# Patient Record
Sex: Female | Born: 1990 | Race: Black or African American | Hispanic: No | Marital: Single | Smoking: Former smoker
Health system: Southern US, Community
[De-identification: ages and names within clinical notes are randomized; demographics above are authoritative.]

## PROBLEM LIST (undated history)

## (undated) ENCOUNTER — Inpatient Hospital Stay (HOSPITAL_COMMUNITY): Payer: Self-pay

## (undated) DIAGNOSIS — E559 Vitamin D deficiency, unspecified: Secondary | ICD-10-CM

## (undated) DIAGNOSIS — Z789 Other specified health status: Secondary | ICD-10-CM

## (undated) HISTORY — DX: Vitamin D deficiency, unspecified: E55.9

## (undated) HISTORY — PX: ELBOW SURGERY: SHX618

---
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
## 2009-09-01 ENCOUNTER — Encounter (HOSPITAL_COMMUNITY): Admission: RE | Admit: 2009-09-01 | Discharge: 2009-10-13 | Payer: Self-pay | Admitting: Internal Medicine

## 2010-03-22 IMAGING — CT CT HEAD W/O CM
1 series · 16 of 30 positions shown, 20 images · non-contrast
Comparison: None

CLINICAL DATA: FALL.

CT HEAD WITHOUT CONTRAST
TECHNIQUE: Contiguous axial images were obtained from the base of
the skull through the vertex without contrast

[Series 2: headseq 4.8 h45s · axial · 0.43mm/px · z∈[-194,-59]mm · 16 of 30 slices shown, 20 images]
[im 2/30  brain]
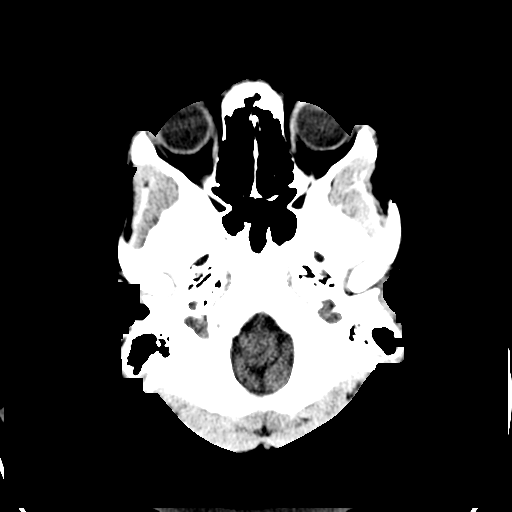
[im 2/30  bone]
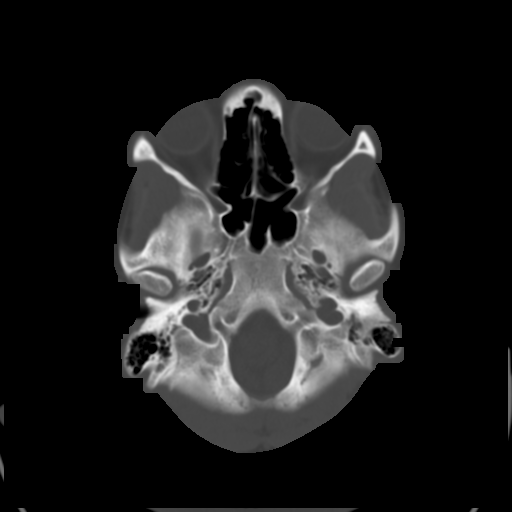
[im 4/30  brain]
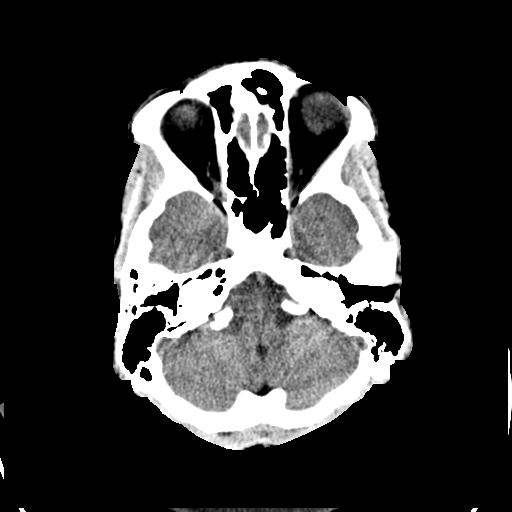
[im 6/30  brain]
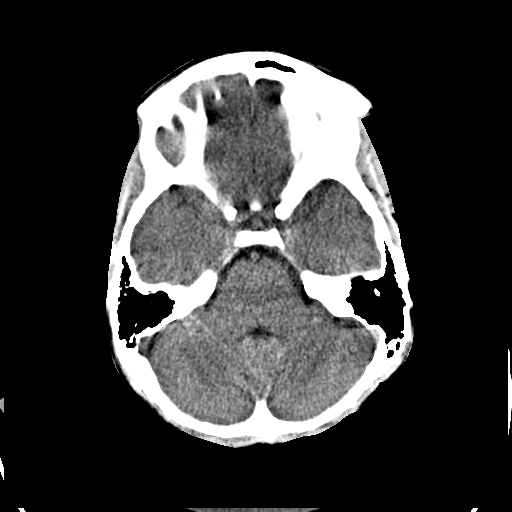
[im 8/30  brain]
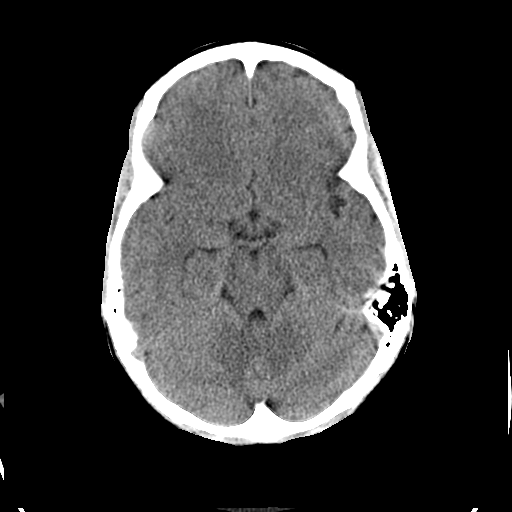
[im 9/30  brain]
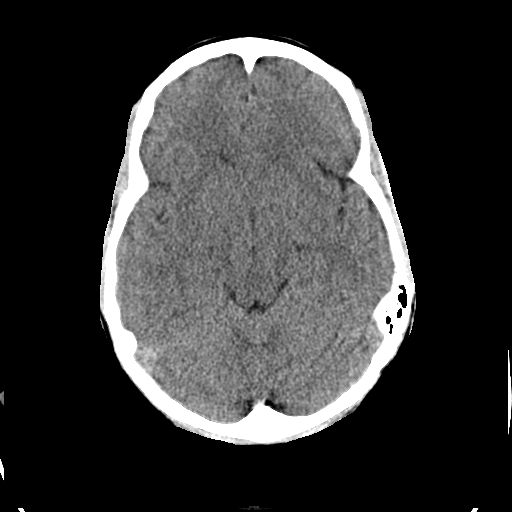
[im 9/30  bone]
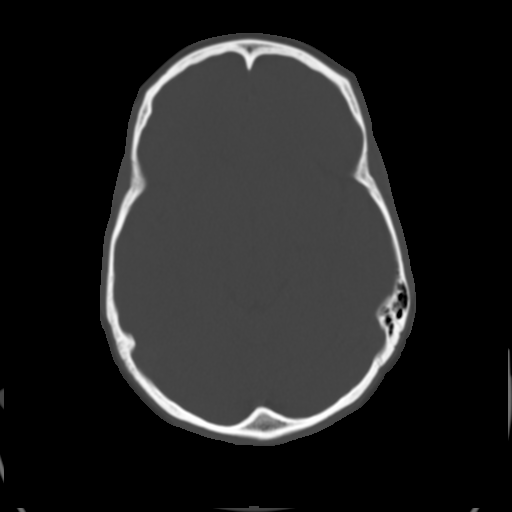
[im 11/30  brain]
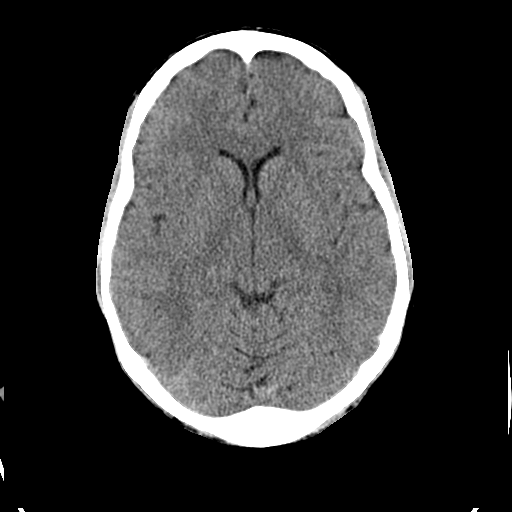
[im 13/30  brain]
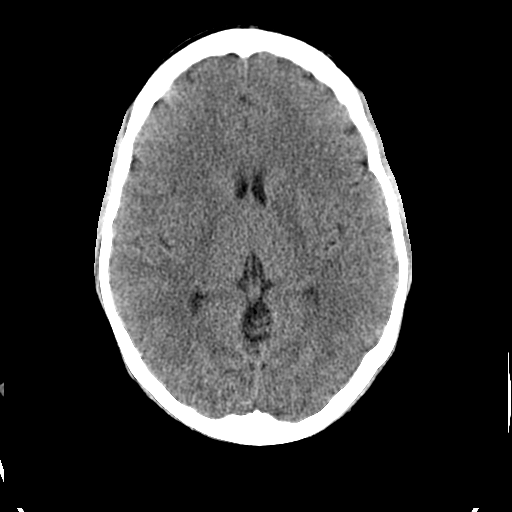
[im 15/30  brain]
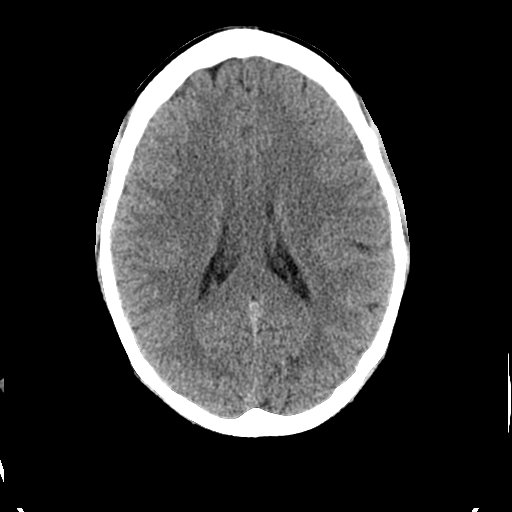
[im 16/30  brain]
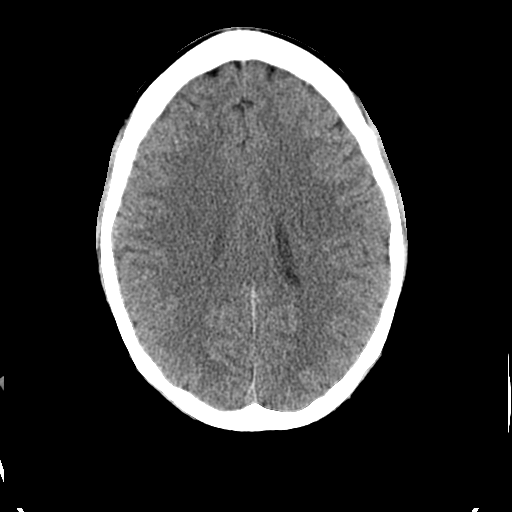
[im 16/30  bone]
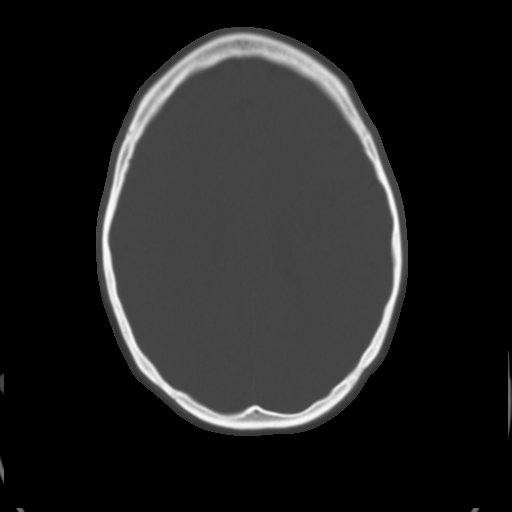
[im 18/30  brain]
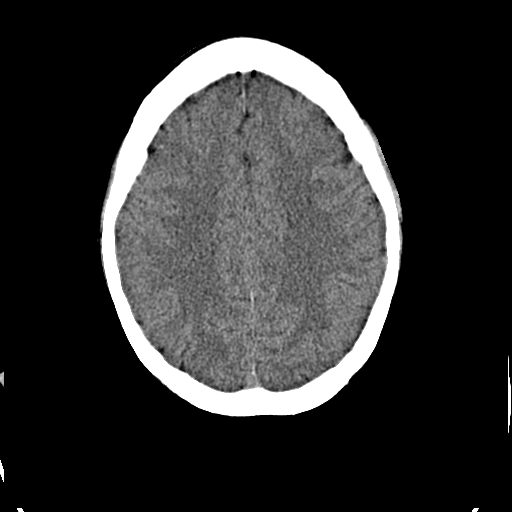
[im 20/30  brain]
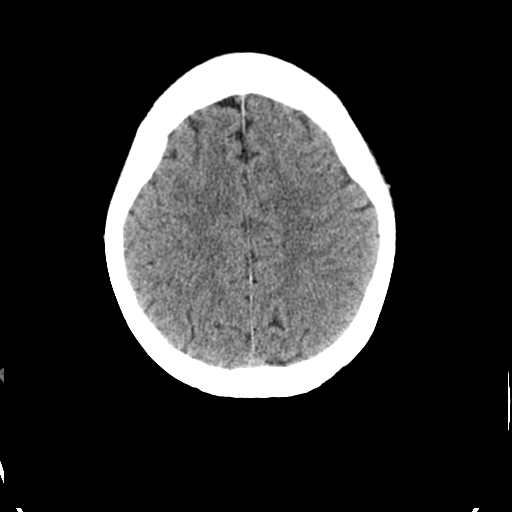
[im 22/30  brain]
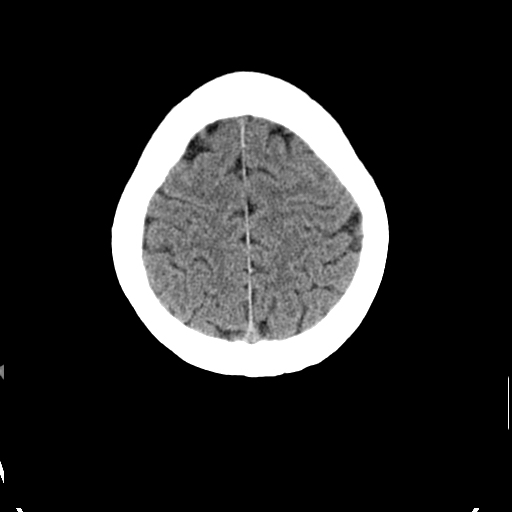
[im 23/30  brain]
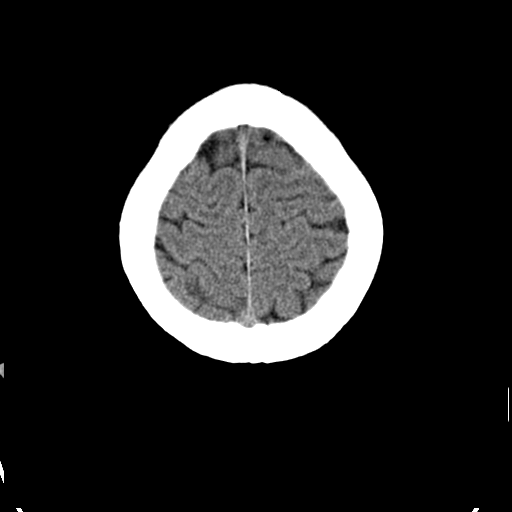
[im 23/30  bone]
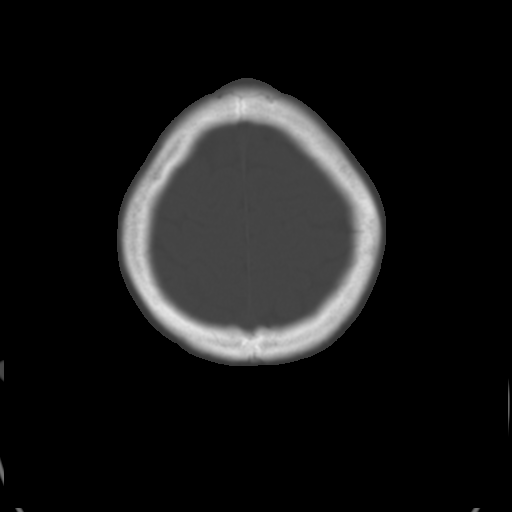
[im 25/30  brain]
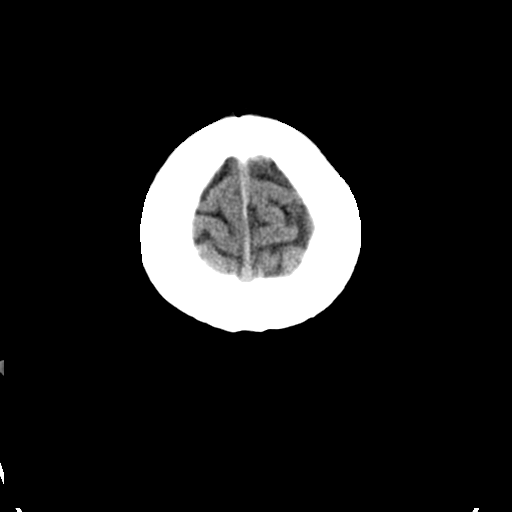
[im 27/30  brain]
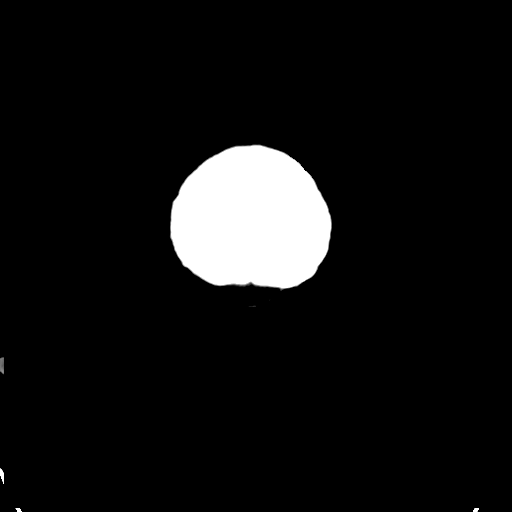
[im 29/30  brain]
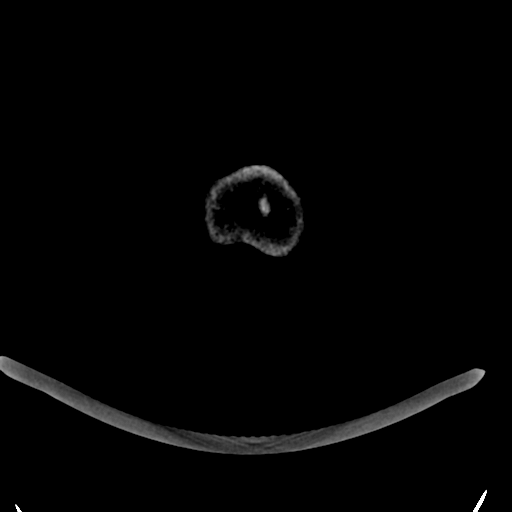

[16 of 30 positions shown; findings below may reference images not displayed]

FINDINGS: The brain has a normal appearance without evidence for
hemorrhage, acute infarction, hydrocephalus, or mass lesion.  There
is no extra axial fluid collection.  The skull and paranasal
sinuses are normal.
IMPRESSION: Normal CT of the head without contrast.

## 2011-02-27 NOTE — Op Note (Signed)
NAME:  Tonya Kane, Tonya Kane              ACCOUNT NO.:  [id number]   MEDICAL RECORD NO.:  i                 PATIENT TYPE:  AMB   LOCATION:  [facility]                         FACILITY:  [facility]   PHYSICIAN:  Deidre Ala, M.D.    DATE OF BIRTH:  01-Apr-1991   DATE OF PROCEDURE:  02/19/2008  DATE OF DISCHARGE:                               OPERATIVE REPORT   PREOPERATIVE DIAGNOSES:  1. 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  2. Lateral patellar tilt and track.   POSTOPERATIVE DIAGNOSES:  1. 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  2. Lateral patellar tilt and track.  3. Degenerative fraying, significant of inner rim lateral meniscus x2.   PROCEDURE:  1. Right and left knee arthroscopy with medial and lateral plica      excisions.  2. Partial lateral meniscectomy.  3. Lateral retinacular releases   SURGEON:  1. Charlesetta Shanks, M.D.   ASSISTANT:  Phineas Semen, P.A.   ANESTHESIA:  General with LMA.   CULTURES:  None.   DRAINS:  None.   ESTIMATED BLOOD LOSS:  Minimal.   TOURNIQUET TIME:  Right 24 minutes, left 24 minutes.   PATHOLOGIC FINDINGS AND HISTORY:  Tonya Kane was sent by Dr. Marisue Brooklyn  of [facility] for bilateral knee pain of several  months duration.  This was in October 2008, secondary to cheerleading.  She had x-rays showing slight patellar tilt and track with a somewhat  flattened lateral femoral condyle.  She did have no VMO atrophy,  somewhat mobile patella on extension, felt better when the patella was  held medially and with a large palpable plica and somewhat palpable  plica, especially laterally left.  We discussed the pathophysiology, put  her in braces, put her on anti-inflammatories.  She came back on January 17, 2008.  She was going to be cheerleading for either [employer] or [employer].  The knees were not getting any better.  She did not want  injections.  She decided to  proceed with surgical intervention.  At  surgery on both knees, we found significant thick fibrous plica bands  all the way over the medial femoral condyle.  There was rubbing of the  medial femoral condyle secondary to this large thick fibrous plica.  She  had pristine joint surfaces, except for the medial femoral condyle where  the patella was tracking.  There was a soft posteromedial patellar facet  with a tight lateral retinaculum.  There was lateral synovitic plica in  the gutter.  She had ACL intact.  Medial meniscus and medial compartment  was intact on both sides and the lateral meniscus inner rim was  significantly frayed.  This was equivalent on both sides.  She underwent  plica excision, lateral release and debridement of the inner rim lateral  meniscus and some light ablation chondroplasty on the medial femoral  condyle where the plica had rubbed on both sides.   DESCRIPTION OF PROCEDURE:  With adequate anesthesia obtained using LMA  technique and 1 gm Ancef given IV prophylaxis, the patient was placed in  the supine position.  Both lower extremities were prepped from the  malleoli to the leg holders in the standard fashion.  After standard  prepping and draping, attention was turned to the right where Esmarch  exsanguination was used.  The tourniquet was let up to 300 mmHg.  Superolateral inflow portal was made, the knee was insufflated with  normal saline with the arthroscopic pump.  Medial and lateral scope  portals were then made the joint was thoroughly inspected.  I then  shaved out the medial plica to the sidewall and lysed the medial bands  and cauterized bleeding points.  I then smoothed the medial femoral  condyle.  I then checked and probed the medial meniscus and ACL.  Portals were reversed and I used basket and shaver to smooth the inner  rim lateral meniscus and seal with the ablator on 1.  I then shaved out  the lateral plica and the gutter, and then did an  arthroscopic lateral  retinacular release from the vastus lateralis to the joint line.  Bleeding points were cauterized.  The knee was then irrigated through  the scope.  Marcaine 0.5% with morphine was injected in and about the  joint.  The portals were left open.  A bulky sterile compressive  dressing was applied with lateral foam pad for tamponade and EZ wrap  placed.  Attention was then turned to the left knee where the exact same  procedure was performed.  The patient then having tolerated the  procedure well was awakened and taken to recovery room in satisfactory  condition to be discharged per outpatient routine.  Given Percocet for  pain and told to call the office for an appointment for recheck  tomorrow.           ______________________________  V. Charlesetta Shanks, M.D.     VEP/MEDQ  D:  02/19/2008  T:  02/19/2008  Job:  [id number]   cc:   Lovenia Kim, D.O.  Fax: [phone]

## 2011-09-20 ENCOUNTER — Encounter (HOSPITAL_COMMUNITY): Payer: Self-pay | Admitting: *Deleted

## 2011-09-25 ENCOUNTER — Encounter (HOSPITAL_COMMUNITY): Payer: Self-pay

## 2011-10-03 ENCOUNTER — Other Ambulatory Visit: Payer: Self-pay | Admitting: Obstetrics and Gynecology

## 2011-10-11 ENCOUNTER — Ambulatory Visit (HOSPITAL_COMMUNITY): Payer: BC Managed Care – PPO | Admitting: Anesthesiology

## 2011-10-11 ENCOUNTER — Ambulatory Visit (HOSPITAL_COMMUNITY)
Admission: RE | Admit: 2011-10-11 | Discharge: 2011-10-11 | Disposition: A | Discharge status: Home / Self Care | Payer: BC Managed Care – PPO | Source: Ambulatory Visit | Attending: Obstetrics and Gynecology | Admitting: Obstetrics and Gynecology

## 2011-10-11 ENCOUNTER — Encounter (HOSPITAL_COMMUNITY): Payer: Self-pay | Admitting: *Deleted

## 2011-10-11 ENCOUNTER — Encounter (HOSPITAL_COMMUNITY)
Admission: RE | Disposition: A | Discharge status: Home / Self Care | Payer: Self-pay | Source: Ambulatory Visit | Attending: Obstetrics and Gynecology

## 2011-10-11 ENCOUNTER — Encounter (HOSPITAL_COMMUNITY): Payer: Self-pay | Admitting: Anesthesiology

## 2011-10-11 ENCOUNTER — Other Ambulatory Visit: Payer: Self-pay | Admitting: Obstetrics and Gynecology

## 2011-10-11 DIAGNOSIS — N840 Polyp of corpus uteri: Secondary | ICD-10-CM

## 2011-10-11 DIAGNOSIS — N938 Other specified abnormal uterine and vaginal bleeding: Secondary | ICD-10-CM | POA: Insufficient documentation

## 2011-10-11 DIAGNOSIS — N949 Unspecified condition associated with female genital organs and menstrual cycle: Secondary | ICD-10-CM | POA: Insufficient documentation

## 2011-10-11 HISTORY — PX: HYSTEROSCOPY WITH D & C: SHX1775

## 2011-10-11 LAB — PREGNANCY, URINE: Preg Test, Ur: NEGATIVE

## 2011-10-11 LAB — CBC
HCT: 36.3 % (ref 36.0–46.0)
Hemoglobin: 11.9 g/dL — ABNORMAL LOW (ref 12.0–15.0)
MCHC: 32.8 g/dL (ref 30.0–36.0)
MCV: 87.3 fL (ref 78.0–100.0)
RBC: 4.16 MIL/uL (ref 3.87–5.11)
WBC: 6.4 10*3/uL (ref 4.0–10.5)

## 2011-10-11 SURGERY — DILATATION AND CURETTAGE /HYSTEROSCOPY
Anesthesia: General | Site: Uterus | Wound class: Clean Contaminated

## 2011-10-11 MED ORDER — GLYCINE 1.5 % IR SOLN
Status: DC | PRN
  Administered 2011-10-11: 3000 mL

## 2011-10-11 MED ORDER — CHLOROPROCAINE HCL 1 % IJ SOLN
INTRAMUSCULAR | Status: DC | PRN
  Administered 2011-10-11: 10 mL

## 2011-10-11 MED ORDER — FENTANYL CITRATE 0.05 MG/ML IJ SOLN
INTRAMUSCULAR | Status: AC
  Filled 2011-10-11: qty 2

## 2011-10-11 MED ORDER — FENTANYL CITRATE 0.05 MG/ML IJ SOLN: 25.0000 ug | INTRAMUSCULAR | Status: DC | PRN

## 2011-10-11 MED ORDER — MIDAZOLAM HCL 2 MG/2ML IJ SOLN
INTRAMUSCULAR | Status: AC
  Filled 2011-10-11: qty 2

## 2011-10-11 MED ORDER — MEPERIDINE HCL 25 MG/ML IJ SOLN: 6.2500 mg | INTRAMUSCULAR | Status: DC | PRN

## 2011-10-11 MED ORDER — LIDOCAINE HCL (CARDIAC) 20 MG/ML IV SOLN
INTRAVENOUS | Status: AC
  Filled 2011-10-11: qty 5

## 2011-10-11 MED ORDER — KETOROLAC TROMETHAMINE 60 MG/2ML IM SOLN
INTRAMUSCULAR | Status: DC | PRN
  Administered 2011-10-11: 30 mg via INTRAMUSCULAR

## 2011-10-11 MED ORDER — ONDANSETRON HCL 4 MG/2ML IJ SOLN: 4.0000 mg | Freq: Once | INTRAMUSCULAR | Status: DC | PRN

## 2011-10-11 MED ORDER — ONDANSETRON HCL 4 MG/2ML IJ SOLN
INTRAMUSCULAR | Status: AC
  Filled 2011-10-11: qty 2

## 2011-10-11 MED ORDER — PROPOFOL 10 MG/ML IV EMUL
INTRAVENOUS | Status: AC
  Filled 2011-10-11: qty 20

## 2011-10-11 MED ORDER — ONDANSETRON HCL 4 MG/2ML IJ SOLN
INTRAMUSCULAR | Status: DC | PRN
  Administered 2011-10-11: 4 mg via INTRAVENOUS

## 2011-10-11 MED ORDER — IBUPROFEN 800 MG PO TABS: 800.0000 mg | ORAL_TABLET | Freq: Three times a day (TID) | ORAL | Status: DC | PRN

## 2011-10-11 MED ORDER — KETOROLAC TROMETHAMINE 30 MG/ML IJ SOLN
INTRAMUSCULAR | Status: DC | PRN
  Administered 2011-10-11: 30 mg via INTRAVENOUS

## 2011-10-11 MED ORDER — LIDOCAINE HCL (CARDIAC) 20 MG/ML IV SOLN
INTRAVENOUS | Status: DC | PRN
  Administered 2011-10-11: 50 mg via INTRAVENOUS

## 2011-10-11 MED ORDER — LACTATED RINGERS IV SOLN: INTRAVENOUS | Status: DC

## 2011-10-11 MED ORDER — PROPOFOL 10 MG/ML IV EMUL
INTRAVENOUS | Status: DC | PRN
  Administered 2011-10-11: 200 mg via INTRAVENOUS

## 2011-10-11 MED ORDER — DEXAMETHASONE SODIUM PHOSPHATE 10 MG/ML IJ SOLN
INTRAMUSCULAR | Status: AC
  Filled 2011-10-11: qty 1

## 2011-10-11 MED ORDER — KETOROLAC TROMETHAMINE 30 MG/ML IJ SOLN: 15.0000 mg | Freq: Once | INTRAMUSCULAR | Status: DC | PRN

## 2011-10-11 MED ORDER — LACTATED RINGERS IV SOLN
INTRAVENOUS | Status: DC
  Administered 2011-10-11: 50 mL/h via INTRAVENOUS
  Administered 2011-10-11: 14:00:00 via INTRAVENOUS

## 2011-10-11 MED ORDER — FENTANYL CITRATE 0.05 MG/ML IJ SOLN
INTRAMUSCULAR | Status: DC | PRN
  Administered 2011-10-11: 100 ug via INTRAVENOUS

## 2011-10-11 MED ORDER — IBUPROFEN 800 MG PO TABS: 800.0000 mg | ORAL_TABLET | Freq: Three times a day (TID) | ORAL | Status: AC | PRN

## 2011-10-11 MED ORDER — MIDAZOLAM HCL 5 MG/5ML IJ SOLN
INTRAMUSCULAR | Status: DC | PRN
  Administered 2011-10-11: 2 mg via INTRAVENOUS

## 2011-10-11 MED ORDER — DEXAMETHASONE SODIUM PHOSPHATE 10 MG/ML IJ SOLN
INTRAMUSCULAR | Status: DC | PRN
  Administered 2011-10-11: 10 mg via INTRAVENOUS

## 2011-10-11 SURGICAL SUPPLY — 17 items
CANISTER SUCTION 2500CC (MISCELLANEOUS) ×3 IMPLANT
CATH ROBINSON RED A/P 16FR (CATHETERS) ×3 IMPLANT
CLOTH BEACON ORANGE TIMEOUT ST (SAFETY) ×3 IMPLANT
CONTAINER PREFILL 10% NBF 60ML (FORM) ×4 IMPLANT
ELECT REM PT RETURN 9FT ADLT (ELECTROSURGICAL) ×3
ELECTRODE REM PT RTRN 9FT ADLT (ELECTROSURGICAL) ×2 IMPLANT
ELECTRODE ROLLER VERSAPOINT (ELECTRODE) IMPLANT
ELECTRODE RT ANGLE VERSAPOINT (CUTTING LOOP) IMPLANT
GLOVE BIO SURGEON STRL SZ 6.5 (GLOVE) ×3 IMPLANT
GLOVE BIOGEL PI IND STRL 7.0 (GLOVE) ×4 IMPLANT
GLOVE BIOGEL PI INDICATOR 7.0 (GLOVE) ×2
GOWN PREVENTION PLUS LG XLONG (DISPOSABLE) ×6 IMPLANT
GOWN STRL REIN XL XLG (GOWN DISPOSABLE) ×3 IMPLANT
LOOP ANGLED CUTTING 22FR (CUTTING LOOP) ×2 IMPLANT
PACK HYSTEROSCOPY LF (CUSTOM PROCEDURE TRAY) ×3 IMPLANT
TOWEL OR 17X24 6PK STRL BLUE (TOWEL DISPOSABLE) ×6 IMPLANT
WATER STERILE IRR 1000ML POUR (IV SOLUTION) ×3 IMPLANT

## 2011-10-11 NOTE — Anesthesia Postprocedure Evaluation (Signed)
Anesthesia Post Note  Patient: Tonya Kane  Procedure(s) Performed:  DILATATION AND CURETTAGE /HYSTEROSCOPY  Anesthesia type: GA  Patient location: PACU  Post pain: Pain level controlled  Post assessment: Post-op Vital signs reviewed  Last Vitals:  Filed Vitals:   10/11/11 1336  BP: 93/54  Pulse: 61  Temp: 37 C  Resp: 16    Post vital signs: Reviewed  Level of consciousness: sedated  Complications: No apparent anesthesia complications

## 2011-10-11 NOTE — Brief Op Note (Signed)
10/11/2011  6:24 PM  PATIENT:  Tonya Kane  20 y.o. female  PRE-OPERATIVE DIAGNOSIS:  Dysfunctional Uterine Bleeding, Endometrial Polyps  POST-OPERATIVE DIAGNOSIS:  Dysfunctional Uterine Bleeding, Endometrial Polyps  PROCEDURE:  Procedure(s):DIAGNOSTIC HYSTEROSCOPY, DILATATION AND CURETTAGE   SURGEON:  Surgeon(s): Chaye Misch Cathie Beams, MD  PHYSICIAN ASSISTANT:   ASSISTANTS: NONE  ANESTHESIA:   general and paracervical block  EBL:  Total I/O In: 1500 [I.V.:1500] Out: 55 [Urine:50; Blood:5]  BLOOD ADMINISTERED:none  DRAINS: none   LOCAL MEDICATIONS USED:  OTHER 1% NESICAINE  SPECIMEN:  Source of Specimen:  endometrial curretting with endom polyp  DISPOSITION OF SPECIMEN:  PATHOLOGY  COUNTS:  YES  TOURNIQUET:  * No tourniquets in log *  DICTATION: .Other Dictation: Dictation Number [id number]  PLAN OF CARE: Discharge to home after PACU  PATIENT DISPOSITION:  PACU - hemodynamically stable.   Delay start of Pharmacological VTE agent (>24hrs) due to surgical blood loss or risk of bleeding:  no

## 2011-10-11 NOTE — Anesthesia Preprocedure Evaluation (Signed)
Anesthesia Evaluation  Patient identified by MRN, date of birth, ID band Patient awake    Reviewed: Allergy & Precautions, H&P , NPO status , Patient's Chart, lab work & pertinent test results  Airway Mallampati: I TM Distance: >3 FB Neck ROM: full    Dental No notable dental hx. (+) Teeth Intact   Pulmonary neg pulmonary ROS,  clear to auscultation  Pulmonary exam normal       Cardiovascular neg cardio ROS     Neuro/Psych Negative Neurological ROS  Negative Psych ROS   GI/Hepatic negative GI ROS, Neg liver ROS,   Endo/Other  Negative Endocrine ROS  Renal/GU negative Renal ROS  Genitourinary negative   Musculoskeletal negative musculoskeletal ROS (+)   Abdominal Normal abdominal exam  (+)   Peds negative pediatric ROS (+)  Hematology negative hematology ROS (+)   Anesthesia Other Findings   Reproductive/Obstetrics negative OB ROS                           Anesthesia Physical Anesthesia Plan  ASA: I  Anesthesia Plan: General   Post-op Pain Management:    Induction: Intravenous  Airway Management Planned: LMA  Additional Equipment:   Intra-op Plan:   Post-operative Plan:   Informed Consent: I have reviewed the patients History and Physical, chart, labs and discussed the procedure including the risks, benefits and alternatives for the proposed anesthesia with the patient or authorized representative who has indicated his/her understanding and acceptance.     Plan Discussed with: CRNA  Anesthesia Plan Comments:         Anesthesia Quick Evaluation

## 2011-10-11 NOTE — Transfer of Care (Signed)
Immediate Anesthesia Transfer of Care Note  Patient: Tonya Kane  Procedure(s) Performed:  DILATATION AND CURETTAGE /HYSTEROSCOPY  Patient Location: PACU  Anesthesia Type: General  Level of Consciousness: sedated  Airway & Oxygen Therapy: Patient Spontanous Breathing and Patient connected to nasal cannula oxygen  Post-op Assessment: Report given to PACU RN and Post -op Vital signs reviewed and stable  Post vital signs: stable  Complications: No apparent anesthesia complications

## 2011-10-12 NOTE — Op Note (Signed)
Tonya Kane, Tonya Kane              ACCOUNT NO.:  [id number]  MEDICAL RECORD NO.:  [id number]  LOCATION:  [facility]                          FACILITY:  [facility]  PHYSICIAN:  Maxie Better, M.D.DATE OF BIRTH:  1991/04/22  DATE OF PROCEDURE:  10/11/2011 DATE OF DISCHARGE:  10/11/2011                              OPERATIVE REPORT   PREOPERATIVE DIAGNOSES:  Dysfunctional uterine bleeding, endometrial polyps.  POSTOPERATIVE DIAGNOSES:  Dysfunctional uterine bleeding, endometrial polyps.  PROCEDURES:  Diagnostic hysteroscopy, dilation and curettage.  ANESTHESIA:  General, paracervical block.  SURGEON:  Maxie Better, MD  ASSISTANT:  None.  PROCEDURE:  Under adequate general anesthesia, the patient was placed in the dorsal lithotomy position.  Examination under anesthesia revealed a small retroverted uterus.  No adnexal masses could be appreciated.  The patient was sterilely prepped and draped in the usual fashion.  Bladder was catheterized for small amount of urine.  Bivalve speculum was placed in the vagina.  1% Nesacaine was injected paracervically at the 3 and 9 o'clock position, a total of 10 mL.  Anterior lip of the cervix was grasped with a single-tooth tenaculum.  The cervix was then serially dilated up to a #25 Pratt dilator.  A diagnostic hysteroscope was introduced into the uterine cavity.  Difficulty with distention was somewhat noted.  There was some polypoid lesions present, but it was not clearly defined.  Therefore, the hysteroscope was removed.  The cervix further dilated up to a #31 Pratt dilator and a resectoscope with a single loop was then placed.  Again, distention was little bit more limited for reason that was unclear.  The tubal ostia were not seen. There was a polypoid lesion ultimately seen in the left cornual area for which a grasper was then used.  The resectoscope was removed.  The diagnostic hysteroscope was then reinserted and utilized to remove  the polyp that was seen.  Once that was done, the hysteroscope was removed and the cavity was gently curetted.  Specimen was endometrial curetting and polyp sent to Pathology.  All instruments were then removed from the vagina.  Estimated blood loss was minimal.  Complication was none.  The patient tolerated the procedure well and was transferred to the recovery room in stable condition.     Maxie Better, M.D.     [place]/MEDQ  D:  10/12/2011  T:  10/12/2011  Job:  [id number]

## 2011-10-13 ENCOUNTER — Encounter (HOSPITAL_COMMUNITY): Payer: Self-pay | Admitting: Obstetrics and Gynecology

## 2013-09-01 ENCOUNTER — Encounter: Payer: Self-pay | Admitting: Physician Assistant

## 2013-09-30 ENCOUNTER — Encounter: Payer: Self-pay | Admitting: Internal Medicine

## 2013-09-30 DIAGNOSIS — E559 Vitamin D deficiency, unspecified: Secondary | ICD-10-CM | POA: Insufficient documentation

## 2013-10-01 ENCOUNTER — Encounter: Payer: Self-pay | Admitting: Physician Assistant

## 2013-11-03 ENCOUNTER — Encounter: Payer: Self-pay | Admitting: Emergency Medicine

## 2013-11-03 ENCOUNTER — Ambulatory Visit (INDEPENDENT_AMBULATORY_CARE_PROVIDER_SITE_OTHER): Payer: 59 | Admitting: Emergency Medicine

## 2013-11-03 VITALS — BP 96/64 | HR 72 | Temp 98.6°F | Resp 16 | Ht 64.0 in | Wt 116.0 lb

## 2013-11-03 DIAGNOSIS — Z113 Encounter for screening for infections with a predominantly sexual mode of transmission: Secondary | ICD-10-CM

## 2013-11-03 DIAGNOSIS — N898 Other specified noninflammatory disorders of vagina: Secondary | ICD-10-CM

## 2013-11-03 LAB — BASIC METABOLIC PANEL WITH GFR
BUN: 11 mg/dL (ref 6–23)
CALCIUM: 9.4 mg/dL (ref 8.4–10.5)
CO2: 29 mEq/L (ref 19–32)
CREATININE: 0.79 mg/dL (ref 0.50–1.10)
Chloride: 102 mEq/L (ref 96–112)
GLUCOSE: 78 mg/dL (ref 70–99)
POTASSIUM: 4 meq/L (ref 3.5–5.3)
Sodium: 136 mEq/L (ref 135–145)

## 2013-11-03 LAB — CBC WITH DIFFERENTIAL/PLATELET
BASOS ABS: 0 10*3/uL (ref 0.0–0.1)
Basophils Relative: 0 % (ref 0–1)
EOS ABS: 0.4 10*3/uL (ref 0.0–0.7)
EOS PCT: 9 % — AB (ref 0–5)
HEMATOCRIT: 43.1 % (ref 36.0–46.0)
HEMOGLOBIN: 14.6 g/dL (ref 12.0–15.0)
Lymphocytes Relative: 42 % (ref 12–46)
Lymphs Abs: 1.9 10*3/uL (ref 0.7–4.0)
MCH: 29.9 pg (ref 26.0–34.0)
MCHC: 33.9 g/dL (ref 30.0–36.0)
MCV: 88.3 fL (ref 78.0–100.0)
MONOS PCT: 8 % (ref 3–12)
Monocytes Absolute: 0.4 10*3/uL (ref 0.1–1.0)
NEUTROS ABS: 1.8 10*3/uL (ref 1.7–7.7)
Neutrophils Relative %: 41 % — ABNORMAL LOW (ref 43–77)
Platelets: 283 10*3/uL (ref 150–400)
RBC: 4.88 MIL/uL (ref 3.87–5.11)
RDW: 13.9 % (ref 11.5–15.5)
WBC: 4.5 10*3/uL (ref 4.0–10.5)

## 2013-11-03 LAB — HEPATITIS PANEL, ACUTE
HCV AB: NEGATIVE
HEP A IGM: NONREACTIVE
HEP B C IGM: NONREACTIVE
Hepatitis B Surface Ag: NEGATIVE

## 2013-11-03 LAB — TSH: TSH: 2.156 u[IU]/mL (ref 0.350–4.500)

## 2013-11-03 LAB — RPR

## 2013-11-03 LAB — HIV ANTIBODY (ROUTINE TESTING W REFLEX): HIV: NONREACTIVE

## 2013-11-03 MED ORDER — FLUCONAZOLE 150 MG PO TABS: 150.0000 mg | ORAL_TABLET | Freq: Every day | ORAL | Status: DC

## 2013-11-03 NOTE — Progress Notes (Signed)
   Subjective:    Patient ID: Tonya LighterJasmine R Kane, female    DOB: 1990/10/21, 23 y.o.   MRN: [id number]  HPI Comments: 23 yo female wants STD panel performed because having increased discharge. She notes she has had discharge in the past that she controled with decreased sugar. She has no other symptoms, except mild fatigue. She denies painful intercourse or obvious signs of STD. She notes thick white discharge.   Current Outpatient Prescriptions on File Prior to Visit  Medication Sig Dispense Refill  . Cholecalciferol (VITAMIN D PO) Take 600 Units by mouth daily.        . Flavoring Agent (BLACK WALNUT FLAVOR) LIQD daily.      [name]. guaifenesin (ROBITUSSIN) 100 MG/5ML syrup Take 400 mg by mouth at bedtime as needed. For cough      . OIL OF OREGANO PO Take by mouth daily.      [name]. OVER THE COUNTER MEDICATION       . Pseudoephedrine-APAP-DM (DAYQUIL PO) Take 2 capsules by mouth daily as needed. For cold symptoms         No current facility-administered medications on file prior to visit.   Review of patient's allergies indicates no known allergies. Past Medical History  Diagnosis Date  . Vitamin D deficiency      Review of Systems  Constitutional: Positive for fatigue.  Genitourinary: Positive for vaginal discharge.  All other systems reviewed and are negative.   BP 96/64  Pulse 72  Temp(Src) 98.6 F (37 C) (Temporal)  Resp 16  Ht 5\' 4"  (1.626 m)  Wt 116 lb (52.617 kg)  BMI 19.90 kg/m2  LMP 09/25/2013     Objective:   Physical Exam  Nursing note and vitals reviewed. Constitutional: She is oriented to person, place, and time. She appears well-developed and well-nourished.  HENT:  Head: Normocephalic and atraumatic.  Right Ear: External ear normal.  Left Ear: External ear normal.  Nose: Nose normal.  Mouth/Throat: Oropharynx is clear and moist.  Eyes: Conjunctivae and EOM are normal.  Neck: Normal range of motion.  Cardiovascular: Normal rate, regular rhythm, normal heart  sounds and intact distal pulses.   Pulmonary/Chest: Effort normal and breath sounds normal.  Abdominal: Soft. Bowel sounds are normal. There is no tenderness.  Genitourinary: Uterus normal. Vaginal discharge found.  [place] thick  Musculoskeletal: Normal range of motion.  Lymphadenopathy:    She has no cervical adenopathy.  Neurological: She is alert and oriented to person, place, and time.  Skin: Skin is warm and dry.  Psychiatric: She has a normal mood and affect. Judgment normal.          Assessment & Plan:  1. Vaginal discharge and STD screening- Check labs, Diflucan 150 mg 1 qwk AD. Restart previous decreased sugar/ yeast diet, w/c with results 2. Fatigue- check labs, increase activity and H2O

## 2013-11-03 NOTE — Patient Instructions (Signed)
Sexually Transmitted Disease A sexually transmitted disease (STD) is a disease or infection. It may be passed from person to person. It usually is passed during sex. STDs can be spread by different types of germs. These germs are bacteria, viruses, and parasites. An STD can be passed through:  Spit (saliva).  Semen.  Blood.  Mucus from the vagina.  Pee (urine). HOW CAN I LESSEN MY CHANCES OF GETTING AN STD?  Only use condoms labeled "latex," dental dams, and lubricants that wash away with water (water soluble). Do not use petroleum jelly or oils.  Get shots (vaccines) for HPV and hepatitis.  Avoid risky sex behavior that can break the skin. WHAT SHOULD I DO IF I THINK I HAVE AN STD?  See your doctor.  Tell your sex partner(s) that you have an STD. They should be tested and treated.  Do not have sex until your doctor says it is OK. WHEN SHOULD I GET HELP? Get help if:  You have bad belly (abdominal) pain.  You are a man and have puffiness (swelling) or pain in your testicles.  You are a woman and have puffiness in your vagina. MAKE SURE YOU:  Understand these instructions. Document Released: 11/08/2004 Document Revised: 07/22/2013 Document Reviewed: 03/27/2013 ExitCare Patient Information 2014 [employer].  

## 2013-11-04 LAB — HSV(HERPES SIMPLEX VRS) I + II AB-IGG
HSV 1 Glycoprotein G Ab, IgG: 0.16 IV
HSV 2 Glycoprotein G Ab, IgG: <0.1 IV

## 2013-11-04 LAB — WET PREP BY MOLECULAR PROBE
CANDIDA SPECIES: NEGATIVE
Gardnerella vaginalis: NEGATIVE
Trichomonas vaginosis: NEGATIVE

## 2013-11-04 LAB — GC PROBE AMPLIFICATION, URINE: GC PROBE AMP, URINE: NEGATIVE

## 2013-11-04 MED ORDER — FLUCONAZOLE 150 MG PO TABS: 150.0000 mg | ORAL_TABLET | ORAL | Status: DC

## 2014-04-06 ENCOUNTER — Encounter: Payer: Self-pay | Admitting: Emergency Medicine

## 2014-04-06 ENCOUNTER — Ambulatory Visit (INDEPENDENT_AMBULATORY_CARE_PROVIDER_SITE_OTHER): Payer: PRIVATE HEALTH INSURANCE | Admitting: Emergency Medicine

## 2014-04-06 VITALS — BP 96/62 | HR 74 | Temp 99.0°F | Resp 16 | Ht 64.0 in | Wt 125.0 lb

## 2014-04-06 DIAGNOSIS — R509 Fever, unspecified: Secondary | ICD-10-CM

## 2014-04-06 DIAGNOSIS — B9789 Other viral agents as the cause of diseases classified elsewhere: Secondary | ICD-10-CM

## 2014-04-06 DIAGNOSIS — B349 Viral infection, unspecified: Secondary | ICD-10-CM

## 2014-04-06 DIAGNOSIS — J029 Acute pharyngitis, unspecified: Secondary | ICD-10-CM

## 2014-04-06 MED ORDER — PREDNISONE 10 MG PO TABS: ORAL_TABLET | ORAL | Status: DC

## 2014-04-06 NOTE — Progress Notes (Signed)
   Subjective:    Patient ID: Tonya Kane, female    DOB: 06/03/1991, 23 y.o.   MRN: [id number]  HPI Comments: [id number] AAF with cold symptoms x 1 day. She notes sore throat and mild sinus congestion. She denies production. She has not tried any OTC. She notes now with mild fever and body aches.  Fever  Associated symptoms include headaches and a sore throat.  Headache  Associated symptoms include a fever and a sore throat.  Sore Throat  Associated symptoms include headaches.      Medication List       This list is accurate as of: 04/06/14  9:12 PM.  Always use your most recent med list.               [facility] Center[facility] Walnut Flavor Liqd  daily.     predniSONE 10 MG tablet  Commonly known as:  DELTASONE  1 po TID x 3 days, 1 PO BID x 3 days, 1 po QD x 5 days       No Known Allergies Past Medical History  Diagnosis Date  . Vitamin D deficiency      Review of Systems  Constitutional: Positive for fever.  HENT: Positive for sore throat.   Musculoskeletal: Positive for arthralgias and myalgias.  Neurological: Positive for headaches.   BP 96/62  Pulse 74  Temp(Src) 99 F (37.2 C) (Temporal)  Resp 16  Ht 5\' 4"  (1.626 m)  Wt 125 lb (56.7 kg)  BMI 21.45 kg/m2  SpO2 97%  LMP 03/16/2014     Objective:   Physical Exam  Nursing note and vitals reviewed. Constitutional: She is oriented to person, place, and time. She appears well-developed and well-nourished.  HENT:  Head: Normocephalic and atraumatic.  Right Ear: External ear normal.  Left Ear: External ear normal.  Nose: Nose normal.  Mouth/Throat: Oropharynx is clear and moist. No oropharyngeal exudate.  !+ tonsils minimal erythema, Cloudy TM's bilaterally   Eyes: Conjunctivae and EOM are normal.  Neck: Normal range of motion.  Cardiovascular: Normal rate, regular rhythm, normal heart sounds and intact distal pulses.   Pulmonary/Chest: Effort normal and breath sounds normal.  Abdominal: Soft. Bowel sounds are normal.  There is no tenderness.  Musculoskeletal: Normal range of motion.  Lymphadenopathy:    She has no cervical adenopathy.  Neurological: She is alert and oriented to person, place, and time.  Skin: Skin is warm and dry.  Psychiatric: She has a normal mood and affect. Judgment normal.      Rapid strept NEG    Assessment & Plan:  1. ? Sore throat- ? Viral illness- Send for throat culture and check CBC- push fluids, pred DP 10 mg AD, Warm salt water gargles daily. 1 tsp liquid benadryl + 1 tsp liquid Maalox, MIX/ GARGLE/ SPIT as needed for pain. w/c if SX increase or ER.   2. Allergic rhinitis- Allegra OTC, increase H2o, allergy hygiene explained.

## 2014-04-06 NOTE — Patient Instructions (Signed)
Sore Throat Warm salt water gargles daily. 1 tsp liquid benadryl + 1 tsp liquid Maalox, MIX/ GARGLE/ SPIT as needed for pain  A sore throat is a painful, burning, sore, or scratchy feeling of the throat. There may be pain or tenderness when swallowing or talking. You may have other symptoms with a sore throat. These include coughing, sneezing, fever, or a swollen neck. A sore throat is often the first sign of another sickness. These sicknesses may include a cold, flu, strep throat, or an infection called mono. Most sore throats go away without medical treatment.  HOME CARE   Only take medicine as told by your doctor.  Drink enough fluids to keep your pee (urine) clear or pale yellow.  Rest as needed.  Try using throat sprays, lozenges, or suck on hard candy (if older than 4 years or as told).  Sip warm liquids, such as broth, herbal tea, or warm water with honey. Try sucking on frozen ice pops or drinking cold liquids.  Rinse the mouth (gargle) with salt water. Mix 1 teaspoon salt with 8 ounces of water.  Do not smoke. Avoid being around others when they are smoking.  Put a humidifier in your bedroom at night to moisten the air. You can also turn on a hot shower and sit in the bathroom for 5-10 minutes. Be sure the bathroom door is closed. GET HELP RIGHT AWAY IF:   You have trouble breathing.  You cannot swallow fluids, soft foods, or your spit (saliva).  You have more puffiness (swelling) in the throat.  Your sore throat does not get better in 7 days.  You feel sick to your stomach (nauseous) and throw up (vomit).  You have a fever or lasting symptoms for more than 2-3 days.  You have a fever and your symptoms suddenly get worse. MAKE SURE YOU:   Understand these instructions.  Will watch your condition.  Will get help right away if you are not doing well or get worse. Document Released: 07/10/2008 Document Revised: 06/25/2012 Document Reviewed: 06/08/2012 [facility] LlLPExitCare  Patient Information 2015 [place], [place]. This information is not intended to replace advice given to you by your health care provider. Make sure you discuss any questions you have with your health care provider.

## 2014-04-07 LAB — CBC WITH DIFFERENTIAL/PLATELET
Basophils Absolute: 0 10*3/uL (ref 0.0–0.1)
Basophils Relative: 0 % (ref 0–1)
Eosinophils Absolute: 0.1 10*3/uL (ref 0.0–0.7)
Eosinophils Relative: 2 % (ref 0–5)
HEMATOCRIT: 39 % (ref 36.0–46.0)
HEMOGLOBIN: 13.2 g/dL (ref 12.0–15.0)
LYMPHS ABS: 1.8 10*3/uL (ref 0.7–4.0)
LYMPHS PCT: 31 % (ref 12–46)
MCH: 29.1 pg (ref 26.0–34.0)
MCHC: 33.8 g/dL (ref 30.0–36.0)
MCV: 85.9 fL (ref 78.0–100.0)
MONO ABS: 0.7 10*3/uL (ref 0.1–1.0)
Monocytes Relative: 12 % (ref 3–12)
Neutro Abs: 3.1 10*3/uL (ref 1.7–7.7)
Neutrophils Relative %: 55 % (ref 43–77)
PLATELETS: 233 10*3/uL (ref 150–400)
RBC: 4.54 MIL/uL (ref 3.87–5.11)
RDW: 13.8 % (ref 11.5–15.5)
WBC: 5.7 10*3/uL (ref 4.0–10.5)

## 2014-04-08 LAB — CULTURE, GROUP A STREP: Organism ID, Bacteria: NORMAL

## 2014-09-01 ENCOUNTER — Encounter: Payer: Self-pay | Admitting: Physician Assistant

## 2014-10-01 ENCOUNTER — Encounter: Payer: Self-pay | Admitting: Physician Assistant

## 2014-10-15 HISTORY — PX: WISDOM TOOTH EXTRACTION: SHX21

## 2015-03-01 ENCOUNTER — Other Ambulatory Visit: Payer: Self-pay | Admitting: Internal Medicine

## 2015-03-01 ENCOUNTER — Encounter: Payer: Self-pay | Admitting: Internal Medicine

## 2015-03-01 ENCOUNTER — Ambulatory Visit (INDEPENDENT_AMBULATORY_CARE_PROVIDER_SITE_OTHER): Payer: BLUE CROSS/BLUE SHIELD | Admitting: Internal Medicine

## 2015-03-01 VITALS — BP 112/68 | HR 74 | Temp 98.6°F | Resp 18 | Ht 64.0 in | Wt 125.0 lb

## 2015-03-01 DIAGNOSIS — N760 Acute vaginitis: Secondary | ICD-10-CM

## 2015-03-01 DIAGNOSIS — Z113 Encounter for screening for infections with a predominantly sexual mode of transmission: Secondary | ICD-10-CM

## 2015-03-01 MED ORDER — FLUCONAZOLE 150 MG PO TABS: 150.0000 mg | ORAL_TABLET | Freq: Once | ORAL | Status: DC

## 2015-03-01 NOTE — Progress Notes (Signed)
   Subjective:    Patient ID: Tonya Kane, female    DOB: Feb 05, 1991, 24 y.o.   MRN: [id number]  Vaginal Pain Associated symptoms include dysuria. Pertinent negatives include no abdominal pain, chills, constipation, diarrhea, fever, frequency, hematuria, nausea, urgency or vomiting.   Patient presents to the office for vaginal pain x 4 days.  She reports that she has had some perivaginal swelling and itching and it is uncomfortable to sit and walk.  She has tried eating yogurt and black walnut oil.  She reports that this does not feel like yeast infections in the past.  She was sexually active recently and did not use condoms.  She reports that this was not a new partner.  Her partner is active with other people.  No history of STDs.  LMP was 2 weeks ago.  She is on a birth control pill.     Review of Systems  Constitutional: Negative for fever, chills and fatigue.  Gastrointestinal: Negative for nausea, vomiting, abdominal pain, diarrhea and constipation.  Genitourinary: Positive for dysuria and vaginal pain. Negative for urgency, frequency, hematuria, vaginal bleeding and difficulty urinating.       Objective:   Physical Exam  Constitutional: She is oriented to person, place, and time. She appears well-developed and well-nourished. No distress.  HENT:  Head: Normocephalic.  Mouth/Throat: Oropharynx is clear and moist. No oropharyngeal exudate.  Eyes: Conjunctivae are normal. No scleral icterus.  Neck: Normal range of motion. Neck supple. No JVD present. No thyromegaly present.  Cardiovascular: Normal rate, regular rhythm, normal heart sounds and intact distal pulses.  Exam reveals no gallop and no friction rub.   No murmur heard. Pulmonary/Chest: Effort normal and breath sounds normal. No respiratory distress. She has no wheezes. She has no rales. She exhibits no tenderness.  Abdominal: Soft. Bowel sounds are normal. She exhibits no distension and no mass. There is no tenderness.  There is no rebound and no guarding.  Genitourinary: Uterus normal. No labial fusion. There is tenderness on the right labia. There is no rash, lesion or injury on the right labia. There is tenderness on the left labia. There is no rash or injury on the left labia. Cervix exhibits no motion tenderness, no discharge and no friability. Right adnexum displays no mass, no tenderness and no fullness. Left adnexum displays no mass, no tenderness and no fullness. There is erythema and tenderness in the vagina. No bleeding in the vagina. No foreign body around the vagina. No signs of injury around the vagina. Vaginal discharge found.  Thick chunky white vaginal discharge, No CMT  Musculoskeletal: Normal range of motion.  Lymphadenopathy:    She has no cervical adenopathy.  Neurological: She is alert and oriented to person, place, and time.  Skin: Skin is warm and dry. She is not diaphoretic.  Psychiatric: She has a normal mood and affect. Her behavior is normal. Judgment and thought content normal.  Nursing note and vitals reviewed.         Assessment & Plan:    1. Vaginitis and vulvovaginitis -diflucan - WET PREP BY MOLECULAR PROBE  2. Screening for STD (sexually transmitted disease) -likely yeast given abx use and exam.  Doubt PID.  Will treat with ceftriaxone and azithromycin if positive.   -advised safe sex practices - HIV antibody - GC/Chlamydia Probe Amp - RPR - WET PREP BY MOLECULAR PROBE

## 2015-03-01 NOTE — Patient Instructions (Signed)

## 2015-03-02 LAB — GC/CHLAMYDIA PROBE AMP
CT PROBE, AMP APTIMA: NEGATIVE
GC PROBE AMP APTIMA: NEGATIVE

## 2015-03-02 LAB — RPR

## 2015-03-02 LAB — WET PREP BY MOLECULAR PROBE
Candida species: POSITIVE — AB
Gardnerella vaginalis: NEGATIVE
Trichomonas vaginosis: NEGATIVE

## 2015-03-02 LAB — HIV ANTIBODY (ROUTINE TESTING W REFLEX): HIV 1&2 Ab, 4th Generation: NONREACTIVE

## 2017-10-10 ENCOUNTER — Ambulatory Visit: Payer: Self-pay | Admitting: Physician Assistant

## 2017-10-17 ENCOUNTER — Ambulatory Visit: Payer: Self-pay | Admitting: Physician Assistant

## 2017-10-17 NOTE — Progress Notes (Signed)
Subjective:    Tonya Kane is a 27 y.o. female who presents for evaluation of amenorrhea, and reported positive home pregnancy test (12/24). She believes she could be pregnant. Pregnancy is desired. Sexual Activity: single partner, contraception: none. Current symptoms also include: breast tenderness, fatigue, nausea, positive home pregnancy test and heart burn. . Last period was abnormal - shorter than normal. She reports they are typically very predictable, lasts 7 day. She is established with [facility]. [facility]. Has not seen her since back from [place]. She endorses some scant spotting this past week.    Patient's last menstrual period was 09/06/2017.   The following portions of the patient's history were reviewed and updated as appropriate: allergies, current medications, past family history, past medical history, past social history, past surgical history and problem list.   Review of Systems Pertinent items noted in HPI and remainder of comprehensive ROS otherwise negative.     Objective:    BP 110/68   Pulse 72   Temp 98.1 F (36.7 C)   Wt 127 lb (57.6 kg)   LMP 09/06/2017   SpO2 99%   BMI 21.80 kg/m   Review of Systems  Constitutional: Negative.  Negative for malaise/fatigue and weight loss.  Respiratory: Negative.   Cardiovascular: Negative for chest pain and palpitations.  Gastrointestinal: Positive for heartburn and nausea. Negative for abdominal pain and vomiting.  Genitourinary: Negative.   Neurological: Negative for dizziness and headaches.  Psychiatric/Behavioral: Negative.     Lab Review Serum  HCG:  Pending   Assessment:    Absence of menstruation.    Physical Exam  Constitutional: She is oriented to person, place, and time. She appears well-developed and well-nourished. No distress.  HENT:  Head: Normocephalic.  Neck: Neck supple. No thyromegaly present.  Cardiovascular: Normal rate, regular rhythm, normal heart sounds and intact  distal pulses.  Pulmonary/Chest: Effort normal and breath sounds normal.  Abdominal: Soft. Bowel sounds are normal.  Neurological: She is alert and oriented to person, place, and time.  Skin: Skin is warm and dry. She is not diaphoretic.  Psychiatric: She has a normal mood and affect. Her behavior is normal. Judgment and thought content normal.  Vitals reviewed.     Plan:    Pregnancy Test: Positive: EDC: 06/13/2018 . Briefly discussed pre-natal care options.  Encouraged well-balanced diet, plenty of rest when needed, pre-natal vitamins daily and walking for exercise. Discussed self-help for nausea, avoiding OTC medications until consulting provider or pharmacist, other than Tylenol as needed, minimal caffeine (1-2 cups daily) and avoiding alcohol. Information about pregnancy safety and medications provided. She will schedule her initial OB visit in the next month with her OB provider after we confirm + serum hcg. Feel free to call with any questions.

## 2017-10-18 ENCOUNTER — Encounter: Payer: Self-pay | Admitting: Adult Health

## 2017-10-18 ENCOUNTER — Ambulatory Visit: Payer: BLUE CROSS/BLUE SHIELD | Admitting: Adult Health

## 2017-10-18 VITALS — BP 110/68 | HR 72 | Temp 98.1°F | Wt 127.0 lb

## 2017-10-18 DIAGNOSIS — Z3201 Encounter for pregnancy test, result positive: Secondary | ICD-10-CM

## 2017-10-18 NOTE — Patient Instructions (Signed)
Commonly Asked Questions During Pregnancy  Cats: A parasite can be excreted in cat feces.  To avoid exposure you need to have another person empty the little box.  If you must empty the litter box you will need to wear gloves.  Wash your hands after handling your cat.  This parasite can also be found in raw or undercooked meat so this should also be avoided.  Colds, Sore Throats, Flu: Please check your medication sheet to see what you can take for symptoms.  If your symptoms are unrelieved by these medications please call the office.  Dental Work: Most any dental work your dentist recommends is permitted.  X-rays should only be taken during the first trimester if absolutely necessary.  Your abdomen should be shielded with a lead apron during all x-rays.  Please notify your provider prior to receiving any x-rays.  Novocaine is fine; gas is not recommended.  If your dentist requires a note from us prior to dental work please call the office and we will provide one for you.  Exercise: Exercise is an important part of staying healthy during your pregnancy.  You may continue most exercises you were accustomed to prior to pregnancy.  Later in your pregnancy you will most likely notice you have difficulty with activities requiring balance like riding a bicycle.  It is important that you listen to your body and avoid activities that put you at a higher risk of falling.  Adequate rest and staying well hydrated are a must!  If you have questions about the safety of specific activities ask your provider.    Exposure to Children with illness: Try to avoid obvious exposure; report any symptoms to us when noted,  If you have chicken pos, red measles or mumps, you should be immune to these diseases.   Please do not take any vaccines while pregnant unless you have checked with your OB provider.  Fetal Movement: After 28 weeks we recommend you do "kick counts" twice daily.  Lie or sit down in a calm quiet environment and  count your baby movements "kicks".  You should feel your baby at least 10 times per hour.  If you have not felt 10 kicks within the first hour get up, walk around and have something sweet to eat or drink then repeat for an additional hour.  If count remains less than 10 per hour notify your provider.  Fumigating: Follow your pest control agent's advice as to how long to stay out of your home.  Ventilate the area well before re-entering.  Hemorrhoids:   Most over-the-counter preparations can be used during pregnancy.  Check your medication to see what is safe to use.  It is important to use a stool softener or fiber in your diet and to drink lots of liquids.  If hemorrhoids seem to be getting worse please call the office.   Hot Tubs:  Hot tubs Jacuzzis and saunas are not recommended while pregnant.  These increase your internal body temperature and should be avoided.  Intercourse:  Sexual intercourse is safe during pregnancy as long as you are comfortable, unless otherwise advised by your provider.  Spotting may occur after intercourse; report any bright red bleeding that is heavier than spotting.  Labor:  If you know that you are in labor, please go to the hospital.  If you are unsure, please call the office and let us help you decide what to do.  Lifting, straining, etc:  If your job requires heavy   lifting or straining please check with your provider for any limitations.  Generally, you should not lift items heavier than that you can lift simply with your hands and arms (no back muscles)  Painting:  Paint fumes do not harm your pregnancy, but may make you ill and should be avoided if possible.  Latex or water based paints have less odor than oils.  Use adequate ventilation while painting.  Permanents & Hair Color:  Chemicals in hair dyes are not recommended as they cause increase hair dryness which can increase hair loss during pregnancy.  " Highlighting" and permanents are allowed.  Dye may be  absorbed differently and permanents may not hold as well during pregnancy.  Sunbathing:  Use a sunscreen, as skin burns easily during pregnancy.  Drink plenty of fluids; avoid over heating.  Tanning Beds:  Because their possible side effects are still unknown, tanning beds are not recommended.  Ultrasound Scans:  Routine ultrasounds are performed at approximately 20 weeks.  You will be able to see your baby's general anatomy an if you would like to know the gender this can usually be determined as well.  If it is questionable when you conceived you may also receive an ultrasound early in your pregnancy for dating purposes.  Otherwise ultrasound exams are not routinely performed unless there is a medical necessity.  Although you can request a scan we ask that you pay for it when conducted because insurance does not cover " patient request" scans.  Work: If your pregnancy proceeds without complications you may work until your due date, unless your physician or employer advises otherwise.  Round Ligament Pain/Pelvic Discomfort:  Sharp, shooting pains not associated with bleeding are fairly common, usually occurring in the second trimester of pregnancy.  They tend to be worse when standing up or when you remain standing for long periods of time.  These are the result of pressure of certain pelvic ligaments called "round ligaments".  Rest, Tylenol and heat seem to be the most effective relief.  As the womb and fetus grow, they rise out of the pelvis and the discomfort improves.  Please notify the office if your pain seems different than that described.  It may represent a more serious condition.  Common Medications Safe in Pregnancy  Acne:      Constipation:  Benzoyl Peroxide     Colace  Clindamycin      Dulcolax Suppository  Topica Erythromycin     Fibercon  Salicylic Acid      Metamucil         Miralax AVOID:        Senakot   Accutane    Cough:  Retin-A       Cough  Drops  Tetracycline      Phenergan w/ Codeine if Rx  Minocycline      Robitussin (Plain & DM)  Antibiotics:     Crabs/Lice:  Ceclor       RID  Cephalosporins    AVOID:  E-Mycins      Kwell  Keflex  Macrobid/Macrodantin   Diarrhea:  Penicillin      Kao-Pectate  Zithromax      Imodium AD         PUSH FLUIDS AVOID:       Cipro     Fever:  Tetracycline      Tylenol (Regular or Extra  Minocycline       Strength)  Levaquin      Extra Strength-Do not            Exceed 8 tabs/24 hrs Caffeine:        <200mg/day (equiv. To 1 cup of coffee or  approx. 3 12 oz sodas)         Gas: Cold/Hayfever:       Gas-X  Benadryl      Mylicon  Claritin       Phazyme  **Claritin-D        Chlor-Trimeton    Headaches:  Dimetapp      ASA-Free Excedrin  Drixoral-Non-Drowsy     Cold Compress  Mucinex (Guaifenasin)     Tylenol (Regular or Extra  Sudafed/Sudafed-12 Hour     Strength)  **Sudafed PE Pseudoephedrine   Tylenol Cold & Sinus     Vicks Vapor Rub  Zyrtec  **AVOID if Problems With Blood Pressure         Heartburn: Avoid lying down for at least 1 hour after meals  Aciphex      Maalox     Rash:  Milk of Magnesia     Benadryl    Mylanta       1% Hydrocortisone Cream  Pepcid  Pepcid Complete   Sleep Aids:  Prevacid      Ambien   Prilosec       Benadryl  Rolaids       Chamomile Tea  Tums (Limit 4/day)     Unisom  Zantac       Tylenol PM         Warm milk-add vanilla or  Hemorrhoids:       Sugar for taste  Anusol/Anusol H.C.  (RX: Analapram 2.5%)  Sugar Substitutes:  Hydrocortisone OTC     Ok in moderation  Preparation H      Tucks        Vaseline lotion applied to tissue with wiping    Herpes:     Throat:  Acyclovir      Oragel  Famvir  Valtrex     Vaccines:         Flu Shot Leg Cramps:       *Gardasil  Benadryl      Hepatitis A         Hepatitis B Nasal Spray:       Pneumovax  Saline Nasal Spray     Polio Booster         Tetanus Nausea:       Tuberculosis test or  PPD  Vitamin B6 25 mg TID   AVOID:    Dramamine      *Gardasil  Emetrol       Live Poliovirus  Ginger Root 250 mg QID    MMR (measles, mumps &  High Complex Carbs @ Bedtime    rebella)  Sea Bands-Accupressure    Varicella (Chickenpox)  Unisom 1/2 tab TID     *No known complications           If received before Pain:         Known pregnancy;   Darvocet       Resume series after  Lortab        Delivery  Percocet    Yeast:   Tramadol      Femstat  Tylenol 3      Gyne-lotrimin  Ultram       Monistat  Vicodin           MISC:         All Sunscreens             Hair Coloring/highlights          Insect Repellant's          (Including DEET)         Mystic Tans  

## 2017-10-19 LAB — HCG, SERUM, QUALITATIVE: Preg, Serum: POSITIVE — AB

## 2017-10-24 ENCOUNTER — Inpatient Hospital Stay (HOSPITAL_COMMUNITY)
Admission: AD | Admit: 2017-10-24 | Discharge: 2017-10-24 | Disposition: A | Discharge status: Home / Self Care | Payer: BLUE CROSS/BLUE SHIELD | Source: Ambulatory Visit | Attending: Obstetrics and Gynecology | Admitting: Obstetrics and Gynecology

## 2017-10-24 ENCOUNTER — Inpatient Hospital Stay (HOSPITAL_COMMUNITY): Payer: BLUE CROSS/BLUE SHIELD

## 2017-10-24 ENCOUNTER — Encounter (HOSPITAL_COMMUNITY): Payer: Self-pay | Admitting: *Deleted

## 2017-10-24 DIAGNOSIS — O99282 Endocrine, nutritional and metabolic diseases complicating pregnancy, second trimester: Secondary | ICD-10-CM | POA: Insufficient documentation

## 2017-10-24 DIAGNOSIS — O00102 Left tubal pregnancy without intrauterine pregnancy: Secondary | ICD-10-CM

## 2017-10-24 DIAGNOSIS — Z79899 Other long term (current) drug therapy: Secondary | ICD-10-CM | POA: Insufficient documentation

## 2017-10-24 DIAGNOSIS — E559 Vitamin D deficiency, unspecified: Secondary | ICD-10-CM | POA: Insufficient documentation

## 2017-10-24 DIAGNOSIS — N939 Abnormal uterine and vaginal bleeding, unspecified: Secondary | ICD-10-CM | POA: Diagnosis present

## 2017-10-24 DIAGNOSIS — O209 Hemorrhage in early pregnancy, unspecified: Secondary | ICD-10-CM

## 2017-10-24 DIAGNOSIS — Z87891 Personal history of nicotine dependence: Secondary | ICD-10-CM | POA: Diagnosis not present

## 2017-10-24 DIAGNOSIS — O009 Unspecified ectopic pregnancy without intrauterine pregnancy: Secondary | ICD-10-CM | POA: Diagnosis not present

## 2017-10-24 DIAGNOSIS — R109 Unspecified abdominal pain: Secondary | ICD-10-CM | POA: Diagnosis present

## 2017-10-24 HISTORY — DX: Other specified health status: Z78.9

## 2017-10-24 LAB — URINALYSIS, ROUTINE W REFLEX MICROSCOPIC
BILIRUBIN URINE: NEGATIVE
Bacteria, UA: NONE SEEN
Glucose, UA: NEGATIVE mg/dL
KETONES UR: 5 mg/dL — AB
LEUKOCYTES UA: NEGATIVE
NITRITE: NEGATIVE
PROTEIN: 30 mg/dL — AB
Specific Gravity, Urine: 1.03 (ref 1.005–1.030)
pH: 5 (ref 5.0–8.0)

## 2017-10-24 LAB — COMPREHENSIVE METABOLIC PANEL
ALT: 18 U/L (ref 14–54)
AST: 19 U/L (ref 15–41)
Albumin: 4.2 g/dL (ref 3.5–5.0)
Alkaline Phosphatase: 54 U/L (ref 38–126)
Anion gap: 7 (ref 5–15)
BUN: 10 mg/dL (ref 6–20)
CHLORIDE: 105 mmol/L (ref 101–111)
CO2: 22 mmol/L (ref 22–32)
CREATININE: 0.78 mg/dL (ref 0.44–1.00)
Calcium: 9.1 mg/dL (ref 8.9–10.3)
Glucose, Bld: 89 mg/dL (ref 65–99)
Potassium: 4.1 mmol/L (ref 3.5–5.1)
Sodium: 134 mmol/L — ABNORMAL LOW (ref 135–145)
Total Bilirubin: 0.5 mg/dL (ref 0.3–1.2)
Total Protein: 7.2 g/dL (ref 6.5–8.1)

## 2017-10-24 LAB — WET PREP, GENITAL
Clue Cells Wet Prep HPF POC: NONE SEEN
SPERM: NONE SEEN
TRICH WET PREP: NONE SEEN
YEAST WET PREP: NONE SEEN

## 2017-10-24 LAB — CBC
HCT: 41.6 % (ref 36.0–46.0)
Hemoglobin: 14.1 g/dL (ref 12.0–15.0)
MCH: 29.6 pg (ref 26.0–34.0)
MCHC: 33.9 g/dL (ref 30.0–36.0)
MCV: 87.2 fL (ref 78.0–100.0)
PLATELETS: 234 10*3/uL (ref 150–400)
RBC: 4.77 MIL/uL (ref 3.87–5.11)
RDW: 13.3 % (ref 11.5–15.5)
WBC: 7 10*3/uL (ref 4.0–10.5)

## 2017-10-24 LAB — ABO/RH: ABO/RH(D): O POS

## 2017-10-24 LAB — HCG, QUANTITATIVE, PREGNANCY: hCG, Beta Chain, Quant, S: 1876 m[IU]/mL — ABNORMAL HIGH (ref <5)

## 2017-10-24 MED ORDER — METHOTREXATE INJECTION FOR WOMEN'S HOSPITAL
50.0000 mg/m2 | Freq: Once | INTRAMUSCULAR | Status: AC
  Administered 2017-10-24: 80 mg via INTRAMUSCULAR
  Filled 2017-10-24: qty 1.6

## 2017-10-24 NOTE — MAU Note (Signed)
Dr. Earlene Plateravis in discussing results & options with pt & S/O.

## 2017-10-24 NOTE — MAU Provider Note (Signed)
Chief Complaint: Abdominal Pain and Vaginal Bleeding  First Provider Initiated Contact with Patient 10/24/17 1210        SUBJECTIVE HPI: Tonya Kane is a 27 y.o. G1P0 at [redacted]w[redacted]d by LMP who presents to maternity admissions reporting vaginal bleeding and cramping.  She has had "light bleeding" since last Monday 10/14/17.   When asked to clarify, she says that there were blood streaks on tissues when wiping, but none in her underwear or on pads.  Over this past weekend 01/05 - 01/06) she developed cramping.  The cramping waxes and wanes and is worse at night.  Last night she had bright red vaginal bleeding for 2-3 hours during which she had to change the pad "a couple" of times.   She has not yet tried any other treatments. She denies vaginal itching/burning, urinary symptoms, h/a, dizziness, n/v, or fever/chills.    She was a patient of Dr. Cherly Hensen 3 years ago and moved away. She reports that "[facility] is no longer goin gto accept my insurance, so I cannot go there again".      Past Medical History:  Diagnosis Date  . Medical history non-contributory   . Vitamin D deficiency         Past Surgical History:  Procedure Laterality Date  . ELBOW SURGERY    . HYSTEROSCOPY W/D&C  10/11/2011   Procedure: DILATATION AND CURETTAGE /HYSTEROSCOPY;  Surgeon: Serita Kyle, MD;  Location: [facility];  Service: Gynecology;  Laterality: N/A;  . KNEE ARTHROSCOPY  2009   both knees  . WISDOM TOOTH EXTRACTION  2016   Social History        Socioeconomic History  . Marital status: Single    Spouse name: Not on file  . Number of children: Not on file  . Years of education: Not on file  . Highest education level: Not on file  Social Needs  . Financial resource strain: Not on file  . Food insecurity - worry: Not on file  . Food insecurity - inability: Not on file  . Transportation needs - medical: Not on file  . Transportation needs - non-medical: Not on file  Occupational History   . Not on file  Tobacco Use  . Smoking status: Former Smoker    Types: Cigarettes    Last attempt to quit: 10/14/2017    Years since quitting: 0.0  . Smokeless tobacco: Never Used  Substance and Sexual Activity  . Alcohol use: No    Alcohol/week: 0.0 oz    Frequency: Never    Comment: 1-2 times a week previously  . Drug use: No    Comment: occasionally marijuana - has stopped  . Sexual activity: Yes  Other Topics Concern  . Not on file  Social History Narrative  . Not on file   No current facility-administered medications on file prior to encounter.          Current Outpatient Medications on File Prior to Encounter  Medication Sig Dispense Refill  . Lactobacillus (PROBIOTIC ACIDOPHILUS PO) Take by mouth.    . NONFORMULARY OR COMPOUNDED ITEM as needed.    . Prenatal MV-Min-Fe Fum-FA-DHA (PRENATAL 1 PO) Take by mouth.          Allergies  Allergen Reactions  . Coconut Fatty Acids   . Monistat [Miconazole] Rash    Burning    I have reviewed patient's Past Medical Hx, Surgical Hx, Family Hx, Social Hx, medications and allergies.   ROS: Other systems negative  Physical Exam  Constitutional: Well-developed, well-nourished female in no acute distress.  Cardiovascular: normal rate Respiratory: normal effort GI: Abd non-tender. Pos BS x 4. Mild tenderness to deep palpation bilateral lower extremities.  MS: Extremities nontender, no edema, normal ROM Neurologic: Alert and oriented x 4.  GU: Neg CVAT. Uterus: mildly tender, cx: smooth, pink, no lesions, scant amt of pink, mucoid vaginal d/c [facility], closed/long/firm, no CMT or friability, no adnexal tenderness   LAB RESULTS LabResultsLast24Hours  Results for orders placed or performed during the hospital encounter of 10/24/17 (from the past 24 hour(s))  Urinalysis, Routine w reflex microscopic     Status: Abnormal   Collection Time: 10/24/17 11:36 AM  Result Value Ref Range    Color, Urine YELLOW YELLOW   APPearance HAZY (A) CLEAR   Specific Gravity, Urine 1.030 1.005 - 1.030   pH 5.0 5.0 - 8.0   Glucose, UA NEGATIVE NEGATIVE mg/dL   Hgb urine dipstick SMALL (A) NEGATIVE   Bilirubin Urine NEGATIVE NEGATIVE   Ketones, ur 5 (A) NEGATIVE mg/dL   Protein, ur 30 (A) NEGATIVE mg/dL   Nitrite NEGATIVE NEGATIVE   Leukocytes, UA NEGATIVE NEGATIVE   RBC / HPF 0-5 0 - 5 RBC/hpf   WBC, UA 0-5 0 - 5 WBC/hpf   Bacteria, UA NONE SEEN NONE SEEN   Squamous Epithelial / LPF 0-5 (A) NONE SEEN   Mucus PRESENT   CBC     Status: None   Collection Time: 10/24/17 12:45 PM  Result Value Ref Range   WBC 7.0 4.0 - 10.5 K/uL   RBC 4.77 3.87 - 5.11 MIL/uL   Hemoglobin 14.1 12.0 - 15.0 g/dL   HCT 86.541.6 78.436.0 - 69.646.0 %   MCV 87.2 78.0 - 100.0 fL   MCH 29.6 26.0 - 34.0 pg   MCHC 33.9 30.0 - 36.0 g/dL   RDW 29.513.3 28.411.5 - 13.215.5 %   Platelets 234 150 - 400 K/uL  ABO/Rh     Status: None (Preliminary result)   Collection Time: 10/24/17 12:45 PM  Result Value Ref Range   ABO/RH(D) O POS   hCG, quantitative, pregnancy     Status: Abnormal   Collection Time: 10/24/17 12:45 PM  Result Value Ref Range   hCG, Beta Chain, Quant, S 1,876 (H) <5 mIU/mL      --/--/O POS (01/10 1245)  IMAGING  ImagingResults  [place] Ob Comp Less 14 Wks  Result Date: 10/24/2017 CLINICAL DATA:  27 year old. Gravida 1 para 0. Patient is 6 weeks 6 days by LMP of 09/06/2017. Quantitative beta HCG is pending. Bright red bleeding today. Pelvic pain for 3 days. Light bleeding for 4 days. EXAM: OBSTETRIC <14 WK US AND TRANSVAGINAL OB US TECHNIQUE: Both transabdominal and transvaginal ultrasound examinations were performed for complete evaluation of the gestation as well as the maternal uterus, adnexal regions, and pelvic cul-de-sac. Transvaginal technique was performed to assess early pregnancy. COMPARISON:  None. FINDINGS: Intrauterine gestational sac: None Yolk sac:  None Embryo:   None Cardiac Activity: None Heart Rate: Absent bpm Subchorionic hemorrhage:  None visualized. Maternal uterus/adnexae: Within the left adnexal region, there is a mass with central anechoic space measuring 1.3 x 1.1 x 1.3 centimeters. There is surrounding peripheral blood flow. The left ovary appears separate from this mass and measures 1.9 centimeters. Right ovary is normal in appearance, 2.0 centimeters. No free pelvic fluid. IMPRESSION: 1. No intrauterine pregnancy. 2. Left adnexal mass suspicious for ectopic pregnancy. 3. No free pelvic fluid.  Critical Value/emergent results were called by telephone at the time of interpretation on 10/24/2017 at 2:13 pm to [facility] , who verbally acknowledged these results. Electronically Signed   By: [name] M.D.   On: 10/24/2017 14:22   US Ob Transvaginal  Result Date: 10/24/2017 CLINICAL DATA:  27 year old. Gravida 1 para 0. Patient is 6 weeks 6 days by LMP of 09/06/2017. Quantitative beta HCG is pending. Bright red bleeding today. Pelvic pain for 3 days. Light bleeding for 4 days. EXAM: OBSTETRIC <14 WK [place] AND TRANSVAGINAL OB US TECHNIQUE: Both transabdominal and transvaginal ultrasound examinations were performed for complete evaluation of the gestation as well as the maternal uterus, adnexal regions, and pelvic cul-de-sac. Transvaginal technique was performed to assess early pregnancy. COMPARISON:  None. FINDINGS: Intrauterine gestational sac: None Yolk sac:  None Embryo:  None Cardiac Activity: None Heart Rate: Absent bpm Subchorionic hemorrhage:  None visualized. Maternal uterus/adnexae: Within the left adnexal region, there is a mass with central anechoic space measuring 1.3 x 1.1 x 1.3 centimeters. There is surrounding peripheral blood flow. The left ovary appears separate from this mass and measures 1.9 centimeters. Right ovary is normal in appearance, 2.0 centimeters. No free pelvic fluid. IMPRESSION: 1. No intrauterine pregnancy. 2. Left adnexal  mass suspicious for ectopic pregnancy. 3. No free pelvic fluid. Critical Value/emergent results were called by telephone at the time of interpretation on 10/24/2017 at 2:13 pm to [facility] , who verbally acknowledged these results. Electronically Signed   By: [name] M.D.   On: 10/24/2017 14:22     MAU Management/MDM: Ordered usual first trimester r/o ectopic labs.   Baseline Ultrasound to rule out ectopic. Blood drawn for Quant HCG, CBC, ABO/Rh  *Consult with Dr. Earlene Plater @ [phone] - notified of patient's complaints, assessments, lab & U/S results - on the way to speak with patient regarding her options  Larose Hires, MS3  10/24/2017  2:26 PM    Assessment/Plan Ectopic Pregnancy - Dr. Earlene Plater in to speak to patient and family about options (MTX vs waiting 48 hrs and return for repeat HCG) - Care assumed by Dr. Earlene Plater @ 1600 - See Dr. Earlene Plater' note concerning plan of care - Dr. Alysia Penna in to speak with patient and family -- patient decides to proceed with MTX - After receiving MTX injection and observed for adverse reactions, instructed to return to MAU for repeat HCG on Day 4 Sunday 10/27/2017 and at [facility] Day 7 10/30/2017 -- Patient advised to arrive at 1100 on Monday 10/28/2017 - Heartstrings info and comfort items given - Letter to [facility] until Monday 10/28/2017   I confirm that I have verified the information documented in the medical student's note and that I have also personally reperformed the physical exam and all medical decision making activities.-- CNM care assumed from medical student @ 1430.  Raelyn Mora, CNM  10/24/2017 3:11 PM

## 2017-10-24 NOTE — MAU Note (Signed)
Dr. Alysia PennaErvin in to discuss options with pt & her mother.

## 2017-10-24 NOTE — Progress Notes (Addendum)
Faculty Note  Patient is 27 yo G1P0 @ 1537w6d by LMP who presents for 10 days of spotting and some cramping. Reports cramping feels like her period coming. Otherwise feeling well except for migraine today, has not had a migraine since college. This is a desired pregnancy and had pregnancy confirmation at outside office yesterday.  PMH: migraines PSH: laparoscopy for ovarian cysts, knee surgery ALL: coconut MEDS: none SH: denies E/T/D  BP 110/82 (BP Location: Right Arm)   Pulse 97   Temp 97.9 F (36.6 C) (Oral)   Resp 16   Ht 5\' 2"  (1.575 m)   Wt 130 lb (59 kg)   LMP 09/06/2017   BMI 23.78 kg/m   BP 110/82 (BP Location: Right Arm)   Pulse 97   Temp 97.9 F (36.6 C) (Oral)   Resp 16   Ht 5\' 2"  (1.575 m)   Wt 130 lb (59 kg)   LMP 09/06/2017   BMI 23.78 kg/m  CONSTITUTIONAL: Well-developed, well-nourished female in mild distress.  HENT:  Normocephalic, atraumatic, External right and left ear normal. Oropharynx is clear and moist EYES: Conjunctivae and EOM are normal. Pupils are equal, round, and reactive to light. No scleral icterus.  NECK: Normal range of motion, supple, no masses.  Normal thyroid.  SKIN: Skin is warm and dry. No rash noted. Not diaphoretic. No erythema. No pallor. NEUROLOGIC: Alert and oriented to person, place, and time. Normal reflexes, muscle tone coordination. No cranial nerve deficit noted. PSYCHIATRIC: Normal mood and affect. Normal behavior. Normal judgment and thought content. ABDOMEN: Soft, no distention noted.  No tenderness, rebound or guarding.  PELVIC: deferred MUSCULOSKELETAL: Normal range of motion. No tenderness.  No cyanosis, clubbing, or edema.  [place] Ob Comp Less 14 Wks  Result Date: 10/24/2017 CLINICAL DATA:  27 year old. Gravida 1 para 0. Patient is 6 weeks 6 days by LMP of 09/06/2017. Quantitative beta HCG is pending. Bright red bleeding today. Pelvic pain for 3 days. Light bleeding for 4 days. EXAM: OBSTETRIC <14 WK US AND TRANSVAGINAL OB US  TECHNIQUE: Both transabdominal and transvaginal ultrasound examinations were performed for complete evaluation of the gestation as well as the maternal uterus, adnexal regions, and pelvic cul-de-sac. Transvaginal technique was performed to assess early pregnancy. COMPARISON:  None. FINDINGS: Intrauterine gestational sac: None Yolk sac:  None Embryo:  None Cardiac Activity: None Heart Rate: Absent bpm Subchorionic hemorrhage:  None visualized. Maternal uterus/adnexae: Within the left adnexal region, there is a mass with central anechoic space measuring 1.3 x 1.1 x 1.3 centimeters. There is surrounding peripheral blood flow. The left ovary appears separate from this mass and measures 1.9 centimeters. Right ovary is normal in appearance, 2.0 centimeters. No free pelvic fluid. IMPRESSION: 1. No intrauterine pregnancy. 2. Left adnexal mass suspicious for ectopic pregnancy. 3. No free pelvic fluid. Critical Value/emergent results were called by telephone at the time of interpretation on 10/24/2017 at 2:13 pm to [facility] , who verbally acknowledged these results. Electronically Signed   By: Norva PavlovElizabeth  Brown M.D.   On: 10/24/2017 14:22   [place] Ob Transvaginal  Result Date: 10/24/2017 CLINICAL DATA:  27 year old. Gravida 1 para 0. Patient is 6 weeks 6 days by LMP of 09/06/2017. Quantitative beta HCG is pending. Bright red bleeding today. Pelvic pain for 3 days. Light bleeding for 4 days. EXAM: OBSTETRIC <14 WK US AND TRANSVAGINAL OB US TECHNIQUE: Both transabdominal and transvaginal ultrasound examinations were performed for complete evaluation of the gestation as well as the maternal uterus, adnexal regions,  and pelvic cul-de-sac. Transvaginal technique was performed to assess early pregnancy. COMPARISON:  None. FINDINGS: Intrauterine gestational sac: None Yolk sac:  None Embryo:  None Cardiac Activity: None Heart Rate: Absent bpm Subchorionic hemorrhage:  None visualized. Maternal uterus/adnexae: Within the left  adnexal region, there is a mass with central anechoic space measuring 1.3 x 1.1 x 1.3 centimeters. There is surrounding peripheral blood flow. The left ovary appears separate from this mass and measures 1.9 centimeters. Right ovary is normal in appearance, 2.0 centimeters. No free pelvic fluid. IMPRESSION: 1. No intrauterine pregnancy. 2. Left adnexal mass suspicious for ectopic pregnancy. 3. No free pelvic fluid. Critical Value/emergent results were called by telephone at the time of interpretation on 10/24/2017 at 2:13 pm to [facility] , who verbally acknowledged these results. Electronically Signed   By: [name] M.D.   On: 10/24/2017 14:22    bHCG 1876  A/P: 27 yo G1P0 @ [redacted]w[redacted]d who presents with cramping and spotting. She is currently pain free. Reviewed lab findings and [place] with patient and partner. Reviewed that findings are suspicious for ectopic pregnancy. Reviewed options including repeat bHCG and [place] in 48 hours, methotrexate therapy and surgery. Reviewed slim possibility that there is an early IUP and methotrexate would cause harm to pregnancy. Reviewed risks/benefits of each, including possibility of rupture of ectopic pregnancy which could be life-threatening and necessitate emergency surgery, risk of methotrexate not working and requiring a second dose or subsequent surgery. Reviewed risks of surgery including infection, hemorrhage, damage to surround tissues and organs. Answered all questions with patient and partner. They are understandably upset and requesting more time to consider options. I reviewed this case with Dr. Alysia Penna, who will take over care.   Baldemar Lenis, M.D. Attending Obstetrician & Gynecologist, [facility] for [employer], [facility]

## 2017-10-24 NOTE — MAU Note (Signed)
MTX education sheet given to pt. 

## 2017-10-24 NOTE — Discharge Instructions (Signed)
Ectopic Pregnancy An ectopic pregnancy is when the fertilized egg attaches (implants) outside the uterus. Most ectopic pregnancies occur in one of the tubes where eggs travel from the ovary to the uterus (fallopian tubes), but the implanting can occur in other locations. In rare cases, ectopic pregnancies occur on the ovary, intestine, pelvis, abdomen, or cervix. In an ectopic pregnancy, the fertilized egg does not have the ability to develop into a normal, healthy baby. A ruptured ectopic pregnancy is one in which tearing or bursting of a fallopian tube causes internal bleeding. Often, there is intense lower abdominal pain, and vaginal bleeding sometimes occurs. Having an ectopic pregnancy can be life-threatening. If this dangerous condition is not treated, it can lead to blood loss, shock, or even death. What are the causes? The most common cause of this condition is damage to one of the fallopian tubes. A fallopian tube may be narrowed or blocked, and that keeps the fertilized egg from reaching the uterus. What increases the risk? This condition is more likely to develop in women of childbearing age who have different levels of risk. The levels of risk can be divided into three categories. High risk  You have gone through infertility treatment.  You have had an ectopic pregnancy before.  You have had surgery on the fallopian tubes, or another surgical procedure, such as an abortion.  You have had surgery to have the fallopian tubes tied (tubal ligation).  You have problems or diseases of the fallopian tubes.  You have been exposed to diethylstilbestrol (DES). This medicine was used until 1971, and it had effects on babies whose mothers took the medicine.  You become pregnant while using an IUD (intrauterine device) for birth control. Moderate risk  You have a history of infertility.  You have had an STI (sexually transmitted infection).  You have a history of pelvic inflammatory  disease (PID).  You have scarring from endometriosis.  You have multiple sexual partners.  You smoke. Low risk  You have had pelvic surgery.  You use vaginal douches.  You became sexually active before age 33. What are the signs or symptoms? Common symptoms of this condition include normal pregnancy symptoms, such as missing a period, nausea, tiredness, abdominal pain, breast tenderness, and bleeding. However, ectopic pregnancy will have additional symptoms, such as:  Pain with intercourse.  Irregular vaginal bleeding or spotting.  Cramping or pain on one side or in the lower abdomen.  Fast heartbeat, low blood pressure, and sweating.  Passing out while having a bowel movement.  Symptoms of a ruptured ectopic pregnancy and internal bleeding may include:  Sudden, severe pain in the abdomen and pelvis.  Dizziness, weakness, light-headedness, or fainting.  Pain in the shoulder or neck area.  How is this diagnosed? This condition is diagnosed by:  A pelvic exam to locate pain or a mass in the abdomen.  A pregnancy test. This blood test checks for the presence as well as the specific level of pregnancy hormone in the bloodstream.  Ultrasound. This is performed if a pregnancy test is positive. In this test, a probe is inserted into the vagina. The probe will detect a fetus, possibly in a location other than the uterus.  Taking a sample of uterus tissue (dilation and curettage, or D&C).  Surgery to perform a visual exam of the inside of the abdomen using a thin, lighted tube that has a tiny camera on the end (laparoscope).  Culdocentesis. This procedure involves inserting a needle at the top  of the vagina, behind the uterus. If blood is present in this area, it may indicate that a fallopian tube is torn.  How is this treated? This condition is treated with medicine or surgery. Medicine  An injection of a medicine (methotrexate) may be given to cause the pregnancy tissue  to be absorbed. This medicine may save your fallopian tube. It may be given if: ? The diagnosis is made early, with no signs of active bleeding. ? The fallopian tube has not ruptured. ? You are considered to be a good candidate for the medicine. Usually, pregnancy hormone blood levels are checked after methotrexate treatment. This is to be sure that the medicine is effective. It may take 4-6 weeks for the pregnancy to be absorbed. Most pregnancies will be absorbed by 3 weeks. Surgery  A laparoscope may be used to remove the pregnancy tissue.  If severe internal bleeding occurs, a larger cut (incision) may be made in the lower abdomen (laparotomy) to remove the fetus and placenta. This is done to stop the bleeding.  Part or all of the fallopian tube may be removed (salpingectomy) along with the fetus and placenta. The fallopian tube may also be repaired during the surgery.  In very rare circumstances, removal of the uterus (hysterectomy) may be required.  After surgery, pregnancy hormone testing may be done to be sure that there is no pregnancy tissue left. Whether your treatment is medicine or surgery, you may receive a Rho (D) immune globulin shot to prevent problems with any future pregnancy. This shot may be given if:  You are Rh-negative and the baby's father is Rh-positive.  You are Rh-negative and you do not know the Rh type of the baby's father.  Follow these instructions at home:  Rest and limit your activity after the procedure for as long as told by your health care provider.  Until your health care provider says that it is safe: ? Do not lift anything that is heavier than 10 lb (4.5 kg), or the limit that your health care provider tells you. ? Avoid physical exercise and any movement that requires effort (is strenuous).  To help prevent constipation: ? Eat a healthy diet that includes fruits, vegetables, and whole grains. ? Drink 6-8 glasses of water per day. Get help  right away if:  You develop worsening pain that is not relieved by medicine.  You have: ? A fever or chills. ? Vaginal bleeding. ? Redness and swelling at the incision site. ? Nausea and vomiting.  You feel dizzy or weak.  You feel light-headed or you faint. This information is not intended to replace advice given to you by your health care provider. Make sure you discuss any questions you have with your health care provider. Document Released: 11/08/2004 Document Revised: 05/30/2016 Document Reviewed: 05/02/2016 Elsevier Interactive Patient Education  2018 [employer]  Methotrexate Treatment for an Ectopic Pregnancy, Care After Refer to this sheet in the next few weeks. These instructions provide you with information on caring for yourself after your procedure. Your health care provider may also give you more specific instructions. Your treatment has been planned according to current medical practices, but problems sometimes occur. Call your health care provider if you have any problems or questions after your procedure. What can I expect after the procedure? You may have some abdominal cramping, vaginal bleeding, and fatigue in the first few days after taking methotrexate. Some other possible side effects of methotrexate include:  Nausea.  Vomiting.  Diarrhea.  Mouth sores.  Swelling or irritation of the lining of your lungs (pneumonitis).  Liver damage.  Hair loss.  Follow these instructions at home: After you have received the methotrexate medicine, you need to be careful of your activities and watch your condition for several weeks. It may take 1 week before your hormone levels return to normal. Activity  Do not have sexual intercourse until your health care provider says it is safe to do so.  You may resume your usual diet.  Limit strenuous activity.  Do not drink alcohol. General instructions  Do not take aspirin, ibuprofen, or naproxen (nonsteroidal  anti-inflammatory drugs [NSAIDs]).  Do not take folic acid, prenatal vitamins, or other vitamins that contain folic acid.  Avoid traveling too far away from your health care provider.  Keep all follow-up visits as told by your health care provider. This is important. Contact a health care provider if:  You cannot control your nausea and vomiting.  You cannot control your diarrhea.  You have sores in your mouth and want treatment.  You need pain medicine for your abdominal pain.  You have a rash.  You are having a reaction to the medicine. Get help right away if:  You have increasing abdominal or pelvic pain.  You notice increased bleeding.  You feel light-headed, or you faint.  You have shortness of breath.  Your heart rate increases.  You have a cough.  You have chills.  You have a fever. This information is not intended to replace advice given to you by your health care provider. Make sure you discuss any questions you have with your health care provider. Document Released: 09/20/2011 Document Revised: 03/08/2016 Document Reviewed: 07/20/2013 Elsevier Interactive Patient Education  2017 [employer]  Human Chorionic Gonadotropin Test Human chorionic gonadotropin (hCG) is a hormone produced during pregnancy by the cells that form the placenta. The placenta is the organ that grows inside your womb (uterus) to nourish a developing baby. When you are pregnant, hCG starts to appear in your blood about 11 days after conception. It continues to go up for the first 8-11 weeks of pregnancy. Your hCG level can be measured with several different types of tests. You may have:  A urine test. ? hCG is eliminated from your body by your kidneys, so a urine test is one way to check for this hormone. ? A urine test only shows whether there is hCG in your urine. It does not measure how much. ? You may have a urine test to find out whether you are pregnant. ? A home pregnancy test  detects whether there is hCG in your urine.  A qualitative blood test. ? Like the urine test, this blood test only shows whether there is hCG in your blood. It does not measure how much. ? You may have this type of blood test to find out whether you are pregnant.  A quantitative blood test. ? This type of blood test measures the amount of hCG in your blood. ? You may have this type of test to diagnose an abnormal pregnancy or determine whether you are at risk of, or have had, a failed pregnancy (miscarriage).  How do I prepare for this test? For the urine test:  Limit your fluid intake before the urine test as directed by your health care provider.  Collect the sample the first time you urinate in the morning.  Let your health care provider know if you have blood in your urine. This may  interfere with the test result.  Some medicines may interfere with the urine and blood tests. Let your health care provider know about all the medicines you are taking. No additional preparation is required for the blood test. What do the results mean? It is your responsibility to obtain your test results. Ask the lab or department performing the test when and how you will get your results. Talk to your health care provider if you have any questions about your test results. The results of the hCG urine test and the qualitative hCG blood test are either positive or negative. The results of the quantitative hCG blood test are reported as a number. hCG is measured in international units per liter (IU/L). Meaning of Negative Test Results A negative result on a urine or qualitative blood test could mean that you are not pregnant. It could also mean the test was done too early to detect hCG. If you still have other signs of pregnancy, the test should be repeated. Meaning of Positive Test Results A positive result on the urine or qualitative blood tests means you are most likely pregnant. Your health care provider  may confirm your pregnancy with an imaging study of the inside of your uterus at 5-6 weeks (ultrasound). Range of Normal Values Ranges for normal values for the quantitative hCG blood test may vary among different labs and hospitals. You should always check with your health care provider after having lab work or other tests done to discuss whether your values are considered within normal limits.  Less than 5 IU/L means it is most likely you are not pregnant.  Greater than 25 IU/L means it is most likely you are pregnant.  Meaning of Results Outside Normal Value Ranges If your hCG level on the quantitative test is not what would be expected, you may have the test again. It may also be important for your health care provider to know whether your hCG level goes up or down over time. Common causes of results outside the normal range include:  Being pregnant with twins (hCG level is higher than expected).  Having an ectopic pregnancy (hCG rises more slowly than expected).  Miscarriage (hCG level falls).  Abnormal growths in the womb (hCG level is higher than expected).  Talk with your health care provider to discuss your results, treatment options, and if necessary, the need for more tests. Talk with your health care provider if you have any questions about your results. This information is not intended to replace advice given to you by your health care provider. Make sure you discuss any questions you have with your health care provider. Document Released: 11/02/2004 Document Revised: 06/06/2016 Document Reviewed: 01/05/2014 Elsevier Interactive Patient Education  2018 [employer].   Methotrexate Treatment for an Ectopic Pregnancy Methotrexate is a medicine that treats a pregnancy condition in which the fetus develops outside the uterus (ectopic pregnancy) by stopping the growth of the fertilized egg. It also helps your body absorb tissue from the egg. This takes between 2 weeks and 6 weeks.  Most ectopic pregnancies can be successfully treated with methotrexate if they are detected early enough. Tell a health care provider about:  Any allergies you have.  All medicines you are taking, including vitamins, herbs, eye drops, creams, and over-the-counter medicines.  Any medical conditions you have. What are the risks? Generally, this is a safe treatment. However, problems can occur, including:  Nausea.  Vomiting.  Diarrhea.  Abdominal cramping.  Mouth sores.  Increased vaginal  bleeding or spotting.  Swelling or irritation of the lining of your lungs (pneumonitis).  Failed treatment and continuation of the pregnancy.  Liver damage.  Hair loss.  There is still a risk of the ectopic pregnancy rupturing while using the methotrexate. What happens before the procedure? Before you take the medicine:  Liver tests, kidney tests, and a complete blood test are performed.  Blood tests are performed to measure the pregnancy hormone levels and to determine your blood type.  If you are Rh-negative and the father is Rh-positive or his Rh type is not known, you will be given a Rho (D) immune globulin shot.  What happens during the procedure? There are two methods that your health care provider may use to give you methotrexate.  One method involves a single dose or injection of the medicine.  Another method involves a series of doses given through several injections.  What happens after the procedure?  You may have some abdominal cramping, vaginal bleeding, and fatigue in the first few days after taking methotrexate.  Blood tests will be taken for several weeks to check the pregnancy hormone levels. The blood tests are performed until there is no more pregnancy hormone detected in the blood. This information is not intended to replace advice given to you by your health care provider. Make sure you discuss any questions you have with your health care provider. Document  Released: 09/25/2001 Document Revised: 03/08/2016 Document Reviewed: 07/20/2013 Elsevier Interactive Patient Education  2017 [employer].   Pelvic Rest Pelvic rest may be recommended if:  Your placenta is partially or completely covering the opening of your cervix (placenta previa).  There is bleeding between the wall of the uterus and the amniotic sac in the first trimester of pregnancy (subchorionic hemorrhage).  You went into labor too early (preterm labor).  Based on your overall health and the health of your baby, your health care provider will decide if pelvic rest is right for you. How do I rest my pelvis? For as long as told by your health care provider:  Do not have sex, sexual stimulation, or an orgasm.  Do not use tampons. Do not douche. Do not put anything in your vagina.  Do not lift anything that is heavier than 10 lb (4.5 kg).  Avoid activities that take a lot of effort (are strenuous).  Avoid any activity in which your pelvic muscles could become strained.  When should I seek medical care? Seek medical care if you have:  Cramping pain in your lower abdomen.  Vaginal discharge.  A low, dull backache.  Regular contractions.  Uterine tightening.  When should I seek immediate medical care? Seek immediate medical care if:  You have vaginal bleeding and you are pregnant.  This information is not intended to replace advice given to you by your health care provider. Make sure you discuss any questions you have with your health care provider. Document Released: 01/26/2011 Document Revised: 03/08/2016 Document Reviewed: 04/04/2015 Elsevier Interactive Patient Education  [employer].

## 2017-10-24 NOTE — MAU Note (Signed)
Pt C/O light bleeding since last Monday, starting cramping over the weekend.  [facility], started having bright red bleeding last night.  Hasn't seen Dr. Cherly Hensenousins for the last 3 years.

## 2017-10-24 NOTE — H&P (Signed)
Chief Complaint: Abdominal Pain and Vaginal Bleeding  First Provider Initiated Contact with Patient 10/24/17 1210        SUBJECTIVE HPI: Tonya Kane is a 27 y.o. G1P0 at 2845w6d by LMP who presents to maternity admissions reporting vaginal bleeding and cramping.  She has had "light bleeding" since last Monday 10/14/17.   When asked to clarify, she says that there were blood streaks on tissues when wiping, but none in her underwear or on pads.  Over this past weekend 01/05 - 01/06) she developed cramping.  The cramping waxes and wanes and is worse at night.  Last night she had bright red vaginal bleeding for 2-3 hours during which she had to change the pad "a couple" of times.   She has not yet tried any other treatments. She denies vaginal itching/burning, urinary symptoms, h/a, dizziness, n/v, or fever/chills.    She was a patient of Dr. Cherly Hensenousins 3 years ago and moved away. She reports that "[facility] is no longer goin gto accept my insurance, so I cannot go there again".  Past Medical History:  Diagnosis Date  . Medical history non-contributory   . Vitamin D deficiency    Past Surgical History:  Procedure Laterality Date  . ELBOW SURGERY    . HYSTEROSCOPY W/D&C  10/11/2011   Procedure: DILATATION AND CURETTAGE /HYSTEROSCOPY;  Surgeon: Serita KyleSheronette A Cousins, MD;  Location: [facility];  Service: Gynecology;  Laterality: N/A;  . KNEE ARTHROSCOPY  2009   both knees  . WISDOM TOOTH EXTRACTION  2016   Social History   Socioeconomic History  . Marital status: Single    Spouse name: Not on file  . Number of children: Not on file  . Years of education: Not on file  . Highest education level: Not on file  Social Needs  . Financial resource strain: Not on file  . Food insecurity - worry: Not on file  . Food insecurity - inability: Not on file  . Transportation needs - medical: Not on file  . Transportation needs - non-medical: Not on file  Occupational History  . Not on file  Tobacco Use  .  Smoking status: Former Smoker    Types: Cigarettes    Last attempt to quit: 10/14/2017    Years since quitting: 0.0  . Smokeless tobacco: Never Used  Substance and Sexual Activity  . Alcohol use: No    Alcohol/week: 0.0 oz    Frequency: Never    Comment: 1-2 times a week previously  . Drug use: No    Comment: occasionally marijuana - has stopped  . Sexual activity: Yes  Other Topics Concern  . Not on file  Social History Narrative  . Not on file   No current facility-administered medications on file prior to encounter.    Current Outpatient Medications on File Prior to Encounter  Medication Sig Dispense Refill  . Lactobacillus (PROBIOTIC ACIDOPHILUS PO) Take by mouth.    . NONFORMULARY OR COMPOUNDED ITEM as needed.    . Prenatal MV-Min-Fe Fum-FA-DHA (PRENATAL 1 PO) Take by mouth.     Allergies  Allergen Reactions  . Coconut Fatty Acids   . Monistat [Miconazole] Rash    Burning    I have reviewed patient's Past Medical Hx, Surgical Hx, Family Hx, Social Hx, medications and allergies.   ROS: Other systems negative   Physical Exam  Constitutional: Well-developed, well-nourished female in no acute distress.  Cardiovascular: normal rate Respiratory: normal effort GI: Abd non-tender. Pos BS x 4.  Mild tenderness to deep palpation bilateral lower extremities.  MS: Extremities nontender, no edema, normal ROM Neurologic: Alert and oriented x 4.  GU: Neg CVAT. Uterus: mildly tender, cx: smooth, pink, no lesions, scant amt of pink, mucoid vaginal d/c -- [facility] GC/CT done, closed/long/firm, no CMT or friability, no adnexal tenderness   LAB RESULTS Results for orders placed or performed during the hospital encounter of 10/24/17 (from the past 24 hour(s))  Urinalysis, Routine w reflex microscopic     Status: Abnormal   Collection Time: 10/24/17 11:36 AM  Result Value Ref Range   Color, Urine YELLOW YELLOW   APPearance HAZY (A) CLEAR   Specific Gravity, Urine 1.030 1.005 - 1.030    pH 5.0 5.0 - 8.0   Glucose, UA NEGATIVE NEGATIVE mg/dL   Hgb urine dipstick SMALL (A) NEGATIVE   Bilirubin Urine NEGATIVE NEGATIVE   Ketones, ur 5 (A) NEGATIVE mg/dL   Protein, ur 30 (A) NEGATIVE mg/dL   Nitrite NEGATIVE NEGATIVE   Leukocytes, UA NEGATIVE NEGATIVE   RBC / HPF 0-5 0 - 5 RBC/hpf   WBC, UA 0-5 0 - 5 WBC/hpf   Bacteria, UA NONE SEEN NONE SEEN   Squamous Epithelial / LPF 0-5 (A) NONE SEEN   Mucus PRESENT   CBC     Status: None   Collection Time: 10/24/17 12:45 PM  Result Value Ref Range   WBC 7.0 4.0 - 10.5 K/uL   RBC 4.77 3.87 - 5.11 MIL/uL   Hemoglobin 14.1 12.0 - 15.0 g/dL   HCT 16.1 09.6 - 04.5 %   MCV 87.2 78.0 - 100.0 fL   MCH 29.6 26.0 - 34.0 pg   MCHC 33.9 30.0 - 36.0 g/dL   RDW 40.9 81.1 - 91.4 %   Platelets 234 150 - 400 K/uL  ABO/Rh     Status: None (Preliminary result)   Collection Time: 10/24/17 12:45 PM  Result Value Ref Range   ABO/RH(D) O POS   hCG, quantitative, pregnancy     Status: Abnormal   Collection Time: 10/24/17 12:45 PM  Result Value Ref Range   hCG, Beta Chain, Quant, S 1,876 (H) <5 mIU/mL    --/--/O POS (01/10 1245)  IMAGING US Ob Comp Less 14 Wks  Result Date: 10/24/2017 CLINICAL DATA:  27 year old. Gravida 1 para 0. Patient is 6 weeks 6 days by LMP of 09/06/2017. Quantitative beta HCG is pending. Bright red bleeding today. Pelvic pain for 3 days. Light bleeding for 4 days. EXAM: OBSTETRIC <14 WK [place] AND TRANSVAGINAL OB US TECHNIQUE: Both transabdominal and transvaginal ultrasound examinations were performed for complete evaluation of the gestation as well as the maternal uterus, adnexal regions, and pelvic cul-de-sac. Transvaginal technique was performed to assess early pregnancy. COMPARISON:  None. FINDINGS: Intrauterine gestational sac: None Yolk sac:  None Embryo:  None Cardiac Activity: None Heart Rate: Absent bpm Subchorionic hemorrhage:  None visualized. Maternal uterus/adnexae: Within the left adnexal region, there is a mass  with central anechoic space measuring 1.3 x 1.1 x 1.3 centimeters. There is surrounding peripheral blood flow. The left ovary appears separate from this mass and measures 1.9 centimeters. Right ovary is normal in appearance, 2.0 centimeters. No free pelvic fluid. IMPRESSION: 1. No intrauterine pregnancy. 2. Left adnexal mass suspicious for ectopic pregnancy. 3. No free pelvic fluid. Critical Value/emergent results were called by telephone at the time of interpretation on 10/24/2017 at 2:13 pm to [facility] , who verbally acknowledged these results. Electronically Signed   By: Lanora Manis  Manson Passey M.D.   On: 10/24/2017 14:22   US Ob Transvaginal  Result Date: 10/24/2017 CLINICAL DATA:  27 year old. Gravida 1 para 0. Patient is 6 weeks 6 days by LMP of 09/06/2017. Quantitative beta HCG is pending. Bright red bleeding today. Pelvic pain for 3 days. Light bleeding for 4 days. EXAM: OBSTETRIC <14 WK [place] AND TRANSVAGINAL OB US TECHNIQUE: Both transabdominal and transvaginal ultrasound examinations were performed for complete evaluation of the gestation as well as the maternal uterus, adnexal regions, and pelvic cul-de-sac. Transvaginal technique was performed to assess early pregnancy. COMPARISON:  None. FINDINGS: Intrauterine gestational sac: None Yolk sac:  None Embryo:  None Cardiac Activity: None Heart Rate: Absent bpm Subchorionic hemorrhage:  None visualized. Maternal uterus/adnexae: Within the left adnexal region, there is a mass with central anechoic space measuring 1.3 x 1.1 x 1.3 centimeters. There is surrounding peripheral blood flow. The left ovary appears separate from this mass and measures 1.9 centimeters. Right ovary is normal in appearance, 2.0 centimeters. No free pelvic fluid. IMPRESSION: 1. No intrauterine pregnancy. 2. Left adnexal mass suspicious for ectopic pregnancy. 3. No free pelvic fluid. Critical Value/emergent results were called by telephone at the time of interpretation on 10/24/2017 at  2:13 pm to [facility] , who verbally acknowledged these results. Electronically Signed   By: [name] M.D.   On: 10/24/2017 14:22    MAU Management/MDM: Ordered usual first trimester r/o ectopic labs.   Baseline Ultrasound to rule out ectopic. Blood drawn for Quant HCG, CBC, ABO/Rh  *Consult with Dr. Earlene Plater @ [phone] - notified of patient's complaints, assessments, lab & U/S results - on the way to speak with patient regarding her options  Larose Hires, MS3  10/24/2017  2:26 PM    Assessment/Plan Ectopic Pregnancy - Dr. Earlene Plater in to speak to patient and family about options - Care assumed by Dr. Earlene Plater @ 1600 - See Dr. Earlene Plater' note concerning plan of care  I confirm that I have verified the information documented in the medical student's note and that I have also personally reperformed the physical exam and all medical decision making activities.-- care assumed from medical student @ 1430.  Raelyn Mora, CNM  10/24/2017 3:11 PM

## 2017-10-25 LAB — GC/CHLAMYDIA PROBE AMP (~~LOC~~) NOT AT ARMC
Chlamydia: NEGATIVE
Neisseria Gonorrhea: NEGATIVE

## 2017-10-27 ENCOUNTER — Inpatient Hospital Stay (HOSPITAL_COMMUNITY)
Admission: AD | Admit: 2017-10-27 | Discharge: 2017-10-27 | Disposition: A | Discharge status: Home / Self Care | Payer: BLUE CROSS/BLUE SHIELD | Source: Ambulatory Visit | Attending: Obstetrics & Gynecology | Admitting: Obstetrics & Gynecology

## 2017-10-27 DIAGNOSIS — O009 Unspecified ectopic pregnancy without intrauterine pregnancy: Secondary | ICD-10-CM | POA: Diagnosis not present

## 2017-10-27 LAB — HCG, QUANTITATIVE, PREGNANCY: hCG, Beta Chain, Quant, S: 1835 m[IU]/mL — ABNORMAL HIGH (ref <5)

## 2017-10-27 NOTE — MAU Provider Note (Signed)
History   Chief Complaint:  Follow-up   Tonya Kane is  27 y.o. G1P0 Patient's last menstrual period was 09/06/2017.[name] Patient is here for follow up of quantitative HCG and ongoing surveillance of physical condition after receiving methotrexate as treatment.  Since her last visit, the patient is with new complaint - she is cramping and having vaginal bleeding like a period.  She is taking acetaminophen for the pain and it is helping some.     General ROS: vaginal bleeding, abdominal cramping, no fever, no nausea or vomiting, no diarrhea or constipation.  Her previous Quantitative HCG values are: 10-24-17  1876  Previous notes and ultrasound reviewed.   Physical Exam   Temperature (!) 97.4 F (36.3 C), last menstrual period 09/06/2017.  Focused Gynecological Exam:  GENERAL: Well-developed, well-nourished female in no acute distress.  HEENT: Normocephalic, atraumatic.  LUNGS: Effort normal  HEART: Regular rate  SKIN: Warm, dry and without erythema  PSYCH: Normal mood and affect   Labs: Results for orders placed or performed during the hospital encounter of 10/27/17 (from the past 24 hour(s))  hCG, quantitative, pregnancy   Collection Time: 10/27/17 12:25 PM  Result Value Ref Range   hCG, Beta Chain, Quant, S 1,835 (H) <5 mIU/mL    Ultrasound Studies:   US Ob Comp Less 14 Wks  Result Date: 10/24/2017 CLINICAL DATA:  27 year old. Gravida 1 para 0. Patient is 6 weeks 6 days by LMP of 09/06/2017. Quantitative beta HCG is pending. Bright red bleeding today. Pelvic pain for 3 days. Light bleeding for 4 days. EXAM: OBSTETRIC <14 WK [place] AND TRANSVAGINAL OB US TECHNIQUE: Both transabdominal and transvaginal ultrasound examinations were performed for complete evaluation of the gestation as well as the maternal uterus, adnexal regions, and pelvic cul-de-sac. Transvaginal technique was performed to assess early pregnancy. COMPARISON:  None. FINDINGS: Intrauterine gestational sac: None  Yolk sac:  None Embryo:  None Cardiac Activity: None Heart Rate: Absent bpm Subchorionic hemorrhage:  None visualized. Maternal uterus/adnexae: Within the left adnexal region, there is a mass with central anechoic space measuring 1.3 x 1.1 x 1.3 centimeters. There is surrounding peripheral blood flow. The left ovary appears separate from this mass and measures 1.9 centimeters. Right ovary is normal in appearance, 2.0 centimeters. No free pelvic fluid. IMPRESSION: 1. No intrauterine pregnancy. 2. Left adnexal mass suspicious for ectopic pregnancy. 3. No free pelvic fluid. Critical Value/emergent results were called by telephone at the time of interpretation on 10/24/2017 at 2:13 pm to [facility] , who verbally acknowledged these results. Electronically Signed   By: [name] M.D.   On: 10/24/2017 14:22   US Ob Transvaginal  Result Date: 10/24/2017 CLINICAL DATA:  27 year old. Gravida 1 para 0. Patient is 6 weeks 6 days by LMP of 09/06/2017. Quantitative beta HCG is pending. Bright red bleeding today. Pelvic pain for 3 days. Light bleeding for 4 days. EXAM: OBSTETRIC <14 WK [place] AND TRANSVAGINAL OB US TECHNIQUE: Both transabdominal and transvaginal ultrasound examinations were performed for complete evaluation of the gestation as well as the maternal uterus, adnexal regions, and pelvic cul-de-sac. Transvaginal technique was performed to assess early pregnancy. COMPARISON:  None. FINDINGS: Intrauterine gestational sac: None Yolk sac:  None Embryo:  None Cardiac Activity: None Heart Rate: Absent bpm Subchorionic hemorrhage:  None visualized. Maternal uterus/adnexae: Within the left adnexal region, there is a mass with central anechoic space measuring 1.3 x 1.1 x 1.3 centimeters. There is surrounding peripheral blood flow. The left ovary appears separate from  this mass and measures 1.9 centimeters. Right ovary is normal in appearance, 2.0 centimeters. No free pelvic fluid. IMPRESSION: 1. No intrauterine  pregnancy. 2. Left adnexal mass suspicious for ectopic pregnancy. 3. No free pelvic fluid. Critical Value/emergent results were called by telephone at the time of interpretation on 10/24/2017 at 2:13 pm [facility] , who verbally acknowledged these results. Electronically Signed   By: Norva PavlovElizabeth  Kane M.D.   On: 10/24/2017 14:22    Assessment: Ectopic pregnancy treated with methotrexate on 10-24-17, follow up labs today.  Very similar lab results today.  Looking for drop in BHCG on Wednesday.  Plan: The patient is instructed to follow up on Wednesday in the [facility] clinic at 9am.  Instructed to stay for the entire 2 hour appointment. Continue pelvic rest - no sex, no tampons, no douching.  Return sooner if you are having worsening abdominal pain or severe vaginal bleeding. Client is in agreement with this plan.  [name] [name] 10/27/2017, 1:51 PM

## 2017-10-27 NOTE — MAU Note (Signed)
Patient here for f/u from Methotrexate for ectopic pregnancy, has vaginal bleeding and cramping which started yesterday.  Changing pad every 6 hours.

## 2017-10-30 ENCOUNTER — Encounter (HOSPITAL_COMMUNITY): Payer: Self-pay | Admitting: *Deleted

## 2017-10-30 ENCOUNTER — Inpatient Hospital Stay (HOSPITAL_COMMUNITY)
Admission: AD | Admit: 2017-10-30 | Discharge: 2017-10-30 | Disposition: A | Discharge status: Home / Self Care | Payer: BLUE CROSS/BLUE SHIELD | Source: Ambulatory Visit | Attending: Obstetrics and Gynecology | Admitting: Obstetrics and Gynecology

## 2017-10-30 ENCOUNTER — Ambulatory Visit: Payer: BLUE CROSS/BLUE SHIELD | Admitting: General Practice

## 2017-10-30 DIAGNOSIS — F32A Depression, unspecified: Secondary | ICD-10-CM

## 2017-10-30 DIAGNOSIS — O3680X0 Pregnancy with inconclusive fetal viability, not applicable or unspecified: Secondary | ICD-10-CM

## 2017-10-30 DIAGNOSIS — F419 Anxiety disorder, unspecified: Secondary | ICD-10-CM | POA: Diagnosis not present

## 2017-10-30 DIAGNOSIS — O00102 Left tubal pregnancy without intrauterine pregnancy: Secondary | ICD-10-CM

## 2017-10-30 DIAGNOSIS — F329 Major depressive disorder, single episode, unspecified: Secondary | ICD-10-CM | POA: Diagnosis not present

## 2017-10-30 DIAGNOSIS — Z87891 Personal history of nicotine dependence: Secondary | ICD-10-CM | POA: Diagnosis not present

## 2017-10-30 LAB — CBC WITH DIFFERENTIAL/PLATELET
BASOS ABS: 0 10*3/uL (ref 0.0–0.1)
Basophils Relative: 0 %
EOS ABS: 0 10*3/uL (ref 0.0–0.7)
EOS PCT: 1 %
HCT: 40 % (ref 36.0–46.0)
Hemoglobin: 13.9 g/dL (ref 12.0–15.0)
Lymphocytes Relative: 42 %
Lymphs Abs: 2.3 10*3/uL (ref 0.7–4.0)
MCH: 30.2 pg (ref 26.0–34.0)
MCHC: 34.8 g/dL (ref 30.0–36.0)
MCV: 86.8 fL (ref 78.0–100.0)
MONO ABS: 0.2 10*3/uL (ref 0.1–1.0)
Monocytes Relative: 4 %
Neutro Abs: 2.9 10*3/uL (ref 1.7–7.7)
Neutrophils Relative %: 53 %
PLATELETS: 276 10*3/uL (ref 150–400)
RBC: 4.61 MIL/uL (ref 3.87–5.11)
RDW: 13.2 % (ref 11.5–15.5)
WBC: 5.5 10*3/uL (ref 4.0–10.5)

## 2017-10-30 LAB — COMPREHENSIVE METABOLIC PANEL
ALT: 17 U/L (ref 14–54)
AST: 17 U/L (ref 15–41)
Albumin: 4.1 g/dL (ref 3.5–5.0)
Alkaline Phosphatase: 50 U/L (ref 38–126)
Anion gap: 8 (ref 5–15)
BUN: 7 mg/dL (ref 6–20)
CHLORIDE: 108 mmol/L (ref 101–111)
CO2: 22 mmol/L (ref 22–32)
Calcium: 8.7 mg/dL — ABNORMAL LOW (ref 8.9–10.3)
Creatinine, Ser: 0.64 mg/dL (ref 0.44–1.00)
GFR calc non Af Amer: >60 mL/min (ref >60)
Glucose, Bld: 91 mg/dL (ref 65–99)
POTASSIUM: 3.4 mmol/L — AB (ref 3.5–5.1)
SODIUM: 138 mmol/L (ref 135–145)
Total Bilirubin: 0.5 mg/dL (ref 0.3–1.2)
Total Protein: 7.3 g/dL (ref 6.5–8.1)

## 2017-10-30 LAB — HCG, QUANTITATIVE, PREGNANCY: HCG, BETA CHAIN, QUANT, S: 2104 m[IU]/mL — AB (ref <5)

## 2017-10-30 MED ORDER — METHOTREXATE INJECTION FOR WOMEN'S HOSPITAL
50.0000 mg/m2 | Freq: Once | INTRAMUSCULAR | Status: AC
  Administered 2017-10-30: 80 mg via INTRAMUSCULAR
  Filled 2017-10-30: qty 1.6

## 2017-10-30 MED ORDER — LORAZEPAM 0.5 MG PO TABS: 0.5000 mg | ORAL_TABLET | Freq: Three times a day (TID) | ORAL | 0 refills | Status: AC | PRN

## 2017-10-30 MED ORDER — SERTRALINE HCL 25 MG PO TABS: 25.0000 mg | ORAL_TABLET | Freq: Every day | ORAL | 0 refills | Status: AC

## 2017-10-30 NOTE — Discharge Instructions (Signed)
Methotrexate Treatment for an Ectopic Pregnancy, Care After °Refer to this sheet in the next few weeks. These instructions provide you with information on caring for yourself after your procedure. Your health care provider may also give you more specific instructions. Your treatment has been planned according to current medical practices, but problems sometimes occur. Call your health care provider if you have any problems or questions after your procedure. °What can I expect after the procedure? °You may have some abdominal cramping, vaginal bleeding, and fatigue in the first few days after taking methotrexate. Some other possible side effects of methotrexate include: °· Nausea. °· Vomiting. °· Diarrhea. °· Mouth sores. °· Swelling or irritation of the lining of your lungs (pneumonitis). °· Liver damage. °· Hair loss. ° °Follow these instructions at home: °After you have received the methotrexate medicine, you need to be careful of your activities and watch your condition for several weeks. It may take 1 week before your hormone levels return to normal. °Activity °· Do not have sexual intercourse until your health care provider says it is safe to do so. °· You may resume your usual diet. °· Limit strenuous activity. °· Do not drink alcohol. °General instructions °· Do not take aspirin, ibuprofen, or naproxen (nonsteroidal anti-inflammatory drugs [NSAIDs]). °· Do not take folic acid, prenatal vitamins, or other vitamins that contain folic acid. °· Avoid traveling too far away from your health care provider. °· Keep all follow-up visits as told by your health care provider. This is important. °Contact a health care provider if: °· You cannot control your nausea and vomiting. °· You cannot control your diarrhea. °· You have sores in your mouth and want treatment. °· You need pain medicine for your abdominal pain. °· You have a rash. °· You are having a reaction to the medicine. °Get help right away if: °· You have  increasing abdominal or pelvic pain. °· You notice increased bleeding. °· You feel light-headed, or you faint. °· You have shortness of breath. °· Your heart rate increases. °· You have a cough. °· You have chills. °· You have a fever. °This information is not intended to replace advice given to you by your health care provider. Make sure you discuss any questions you have with your health care provider. °Document Released: 09/20/2011 Document Revised: 03/08/2016 Document Reviewed: 07/20/2013 °Elsevier Interactive Patient Education © 2017 [employer] ° °

## 2017-10-30 NOTE — Progress Notes (Signed)
Patient here for stat bhcg for day #7 labs following MTX. Patient denies pain and states bleeding has slowed down. Discussed with patient we are monitoring her pregnancy hormone levels today and they will determine if she needs another dose MTX today or not. Asked she wait in lobby for results & updated plan of care. Patient verbalized understanding & has no questions at this time.   Reviewed results with Cleone Slimaroline Neill who recommends patient go to MAU for 2nd dose of MTX. Informed patient of results. MAU notified & patient escorted upstairs

## 2017-10-30 NOTE — Progress Notes (Signed)
CSW received call from MAU RN stating that patient has requested to speak with a CSW today.  CSW met with patient in room 8 to offer support and complete assessment in light of her visit to MAU due to ectopic pregnancy.  Patient was lying down crying when CSW arrived, but she agreed to CSW's visit and sat up to talk with CSW.  She continued to cry at times throughout the assessment, which CSW encouraged her to allow.  She was extremely receptive to the therapeutic process and easily engaged while CSW provided crisis intervention and supportive brief counseling in order to help patient process her feelings, identify positive coping mechanisms, and develop a plan for treatment.   Patient reports that she received her graduate degree in [place] and moved back home to [place] over the summer.  She has been in a relationship with her boyfriend/Andrew for 6 months, but has known him since she was 6 and dated him in high school.  She states they broke up amicably when she left for [place].  She reports he is a great support for her and states they have a positive relationship.  Patient states her mother had multiple strokes around Thanksgiving and has lost her short-term memory.  She had a dx of Lupus prior to this.  She told CSW that her parents are in the midst of a divorce after 36 years of marriage.  She states she has great friends, who offer emotional support, but who live all over the world-none here locally.  There seems to be a strained relationship with Andrew's mother and patient states that they had been living with his aunt, who suddenly decided that they need to move out by this weekend.  This does not seem to be patient's main concern, as she is not requesting assistance with finding shelter, but rather noting this as an additional stressor at this point in her life.  She states she is angry about this situation.  She thinks of herself as her mother's primary caregiver at this time and hopes that she and  Mitzi Hansen can find a place where her mother can move in with them.  She states she has a strained relationship with her father because he is "bitter" right now about everything that is going on.  CSW feels this needs to be explored further in therapy, but time did not allow now.  Mitzi Hansen has a younger brother (3), who is not in a place to provide support, and patient reports that she has a 50 year old brother who has "Paranoid Manic Schizophrenia" and homeless.   Patient states she was not sure if she ever wanted to have children and was "a little panicked" when she learned of the pregnancy.  She states she and Mitzi Hansen quickly accepted it and began planning to be a family.  She states this "blow" (ectopic pregnancy) is just one more thing she is dealing with and feels like it is "something else every single day and I want it to stop."  She feels devastated at the loss of this pregnancy.  She also states that she has not yet found a job, which is getting her down.  She states "I'm sad.  I cry every day.  I have a panic attack almost every day."   Patient is future oriented and talks about goals and wanting to feel better.  CSW helped her identify strengths and encouraged her to think about positives each day.  CSW validated her feelings and encouraged  her to allow herself to be emotional while recommending outpatient therapy and medication to treat what CSW feels is a significant episode of Anxiety and Depression.  Patient easily contracted for safety and appreciated CSW evaluating her for suicidal ideation.  She states she sometimes wishes she could go to sleep and not wake up or that all of these stressors would just go away.  She feels exhausted, but denies wanting to harm herself or anyone else.  CSW spoke to her about CSW's view that having thoughts of not wanting to exist during a major depressive episode is normal, but it becomes a crisis when a person begins to plan how they might accomplish ending their life.   Patient thanked CSW for drawing this line for her and again denies feeling suicidal.  She promises to treat it as an emergency if she begins to plan suicide at any time and thanked CSW for concern for her emotional wellbeing. Patient is interested in counseling and states she has already started, because she realized that she needs help and wants to feel better emotionally.  She states that she has seen a counselor named "Ms. Owens Shark," (she cannot recall her first name and states she is in a practice by herself).  She states she likes talking with her and that she is supportive, but that she feels she is just talking to a good friend and needs someone to push her to reach her goals.  She states she is seeing Nelson Chimes in [place].  CSW provided her with resources if she feels this is not a therapeutic relationship either.  Patient was appreciative.  CSW feels patient would benefit from an antidepressant to boost chemicals in her brain to help her cope with the multitude of stressors in her life currently.  Patient states she has been thinking about this possibility the past few days and although she is a "hippie" and not sure she likes the thought of taking medication, she states she has been self-medicating with alcohol and marijuana (prior to +UPT) and feels she may benefit from medication.  We spoke at length about SSRIs vs Benzodiazepines.  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  She told CSW that she was prescribed Prozac her senior year in college, along with another medication, but the way her MD instructed her to take the medication (one of the pills if she was "up" and the other if she was "down") never made sense to her and made her feel worse.  She states understanding on the proper way to take an SSRI and understanding that it can take 4-6 weeks to reach a therapeutic level in the blood stream.   CSW feels patient could benefit from starting this medication today since it can take weeks to months to get an appointment with a psychiatrist and spoke with provider about this who is in agreement to talking with patient about an Rx for Zoloft.  CSW is appreciative of this collaboration.  Patient has information about how to locate a psychiatrist accepting her insurance.  CSW feels patient is motivated to improve her mental health and thinks she will follow up with counseling and medication management.  She understands that a provider in [facility] will not continue to prescribe medication or follow her care.

## 2017-10-30 NOTE — MAU Provider Note (Signed)
History     CSN: [id number]  Arrival date and time: 10/30/17 1234   None     No chief complaint on file.  HPI    Ms.Aeva R Kane is a 27 y.o. female G1P0 @ 68w5dwith a known ectopic here in MAU for a second dose of MTX. States she was seen in the office today and her levels did not drop as expected. She denies pain or bleeding right now. Patient upset in MAU today. States this has been a very long and hard process for her. States she has a lot going on in her personal like with her mom being sick and requiring 24 hour care, and then this unexpected pregnancy and loss. States she has been having panic attacks. States she is very serious about getting her anxiety and depression under control. States she has been crying a lot since the loss of this pregnancy. Has an appointment with a therapist this month. Requesting to see social work today for resources.   OB History    Gravida Para Term Preterm AB Living   1             SAB TAB Ectopic Multiple Live Births                  Past Medical History:  Diagnosis Date  . Medical history non-contributory   . Vitamin D deficiency     Past Surgical History:  Procedure Laterality Date  . ELBOW SURGERY    . HYSTEROSCOPY W/D&C  10/11/2011   Procedure: DILATATION AND CURETTAGE /HYSTEROSCOPY;  Surgeon: SMarvene Staff MD;  [facility];  Service: Gynecology;  Laterality: N/A;  . KNEE ARTHROSCOPY  2009   both knees  . WISDOM TOOTH EXTRACTION  2016    Family History  Problem Relation Age of Onset  . Lupus Mother   . Stroke Mother   . Hyperlipidemia Father   . Hypertension Father   . Schizophrenia Brother   . Breast cancer Paternal Grandmother   . Leukemia Paternal Grandmother   . ALS Maternal Grandmother     Social History   Tobacco Use  . Smoking status: Former Smoker    Types: Cigarettes    Last attempt to quit: 10/14/2017    Years since quitting: 0.0  . Smokeless tobacco: Never Used  Substance Use Topics   . Alcohol use: No    Alcohol/week: 0.0 oz    Frequency: Never    Comment: 1-2 times a week previously  . Drug use: No    Comment: occasionally marijuana - has stopped    Allergies:  Allergies  Allergen Reactions  . Coconut Fatty Acids Swelling    Of the throat  . Monistat [Miconazole] Rash    Burning    Medications Prior to Admission  Medication Sig Dispense Refill Last Dose  . NONFORMULARY OR COMPOUNDED ITEM as needed.   10/10/2017  . Prenatal MV-Min-Fe Fum-FA-DHA (PRENATAL 1 PO) Take 1 tablet by mouth daily.    10/23/2017 at Unknown time   Results for orders placed or performed during the hospital encounter of 10/30/17 (from the past 48 hour(s))  CBC with Differential     Status: None   Collection Time: 10/30/17  1:36 PM  Result Value Ref Range   WBC 5.5 4.0 - 10.5 K/uL   RBC 4.61 3.87 - 5.11 MIL/uL   Hemoglobin 13.9 12.0 - 15.0 g/dL   HCT 40.0 36.0 - 46.0 %   MCV 86.8 78.0 - 100.0  fL   MCH 30.2 26.0 - 34.0 pg   MCHC 34.8 30.0 - 36.0 g/dL   RDW 13.2 11.5 - 15.5 %   Platelets 276 150 - 400 K/uL   Neutrophils Relative % 53 %   Neutro Abs 2.9 1.7 - 7.7 K/uL   Lymphocytes Relative 42 %   Lymphs Abs 2.3 0.7 - 4.0 K/uL   Monocytes Relative 4 %   Monocytes Absolute 0.2 0.1 - 1.0 K/uL   Eosinophils Relative 1 %   Eosinophils Absolute 0.0 0.0 - 0.7 K/uL   Basophils Relative 0 %   Basophils Absolute 0.0 0.0 - 0.1 K/uL  Comprehensive metabolic panel     Status: Abnormal   Collection Time: 10/30/17  1:36 PM  Result Value Ref Range   Sodium 138 135 - 145 mmol/L   Potassium 3.4 (L) 3.5 - 5.1 mmol/L   Chloride 108 101 - 111 mmol/L   CO2 22 22 - 32 mmol/L   Glucose, Bld 91 65 - 99 mg/dL   BUN 7 6 - 20 mg/dL   Creatinine, Ser 0.64 0.44 - 1.00 mg/dL   Calcium 8.7 (L) 8.9 - 10.3 mg/dL   Total Protein 7.3 6.5 - 8.1 g/dL   Albumin 4.1 3.5 - 5.0 g/dL   AST 17 15 - 41 U/L   ALT 17 14 - 54 U/L   Alkaline Phosphatase 50 38 - 126 U/L   Total Bilirubin 0.5 0.3 - 1.2 mg/dL   GFR  calc non Af Amer >60 >60 mL/min   GFR calc Af Amer >60 >60 mL/min    Comment: (NOTE) The eGFR has been calculated using the CKD EPI equation. This calculation has not been validated in all clinical situations. eGFR's persistently <60 mL/min signify possible Chronic Kidney Disease.    Anion gap 8 5 - 15    Review of Systems  Constitutional: Negative for fever.  Gastrointestinal: Negative for abdominal pain.  Genitourinary: Negative for vaginal bleeding.   Physical Exam   Blood pressure 103/74, pulse 64, temperature (!) 97.4 F (36.3 C), temperature source Oral, resp. rate 16, height '5\' 2"'  (1.575 m), weight 129 lb (58.5 kg), last menstrual period 09/06/2017, SpO2 99 %.  Physical Exam  Constitutional: She is oriented to person, place, and time. She appears well-developed and well-nourished. No distress.  HENT:  Head: Normocephalic.  Eyes: Pupils are equal, round, and reactive to light.  Respiratory: Effort normal.  Musculoskeletal: Normal range of motion.  Neurological: She is alert and oriented to person, place, and time.  Skin: Skin is warm. She is not diaphoretic.  Psychiatric: Her behavior is normal. Thought content normal. Her mood appears anxious. Thought content is not paranoid and not delusional. She expresses no homicidal and no suicidal ideation. She expresses no suicidal plans and no homicidal plans.    MAU Course  Procedures  None  MDM  Reviewed beta hcg levels in detail Quant 1/10: 1876 Quant 1/13: 1835 Quant 1/16: 2104 Per protocol patient meets criteria for 2nd methotrexate injection, patient counseled and agreeable with plan of care.  MTX given today, CBC & CMP reviewed  Social worker at bedside.   Assessment and Plan   A:  1. Left tubal pregnancy without intrauterine pregnancy   2. Anxiety and depression     P:  Discharge home with strict ectopic precautions Pelvic rest Rx: Zoloft, start at 25 mg daily then increase to 50 mg after one week if  tolerates medication Rx: ativan #6 no refill. Do not  drink alcohol while taking  Return to MAU on Saturday 1/19 for day 4 MTX labs Return to MAU sooner if symptoms worsen Follow up with Therapist as scheduled  Support given   Noni Saupe I, NP 10/30/2017 6:04 PM

## 2017-10-30 NOTE — MAU Note (Signed)
Pt sent up from clinic with rising BHCG's and need for second dose of MTX. Denies pain but states her bleeding is "like a period". Pt request to speak with a Child psychotherapistsocial worker.

## 2017-10-30 NOTE — Progress Notes (Signed)
Chart reviewed for nurse visit. Agree with plan of care.   Inappropriate rise in bHCG on day 7. Patient denies any pain or vaginal bleeding. Reviewed with Dr. Debroah LoopArnold. Patient to go to MAU for further evaluation and second dose of methotrexate. Report called to MAU RN and provider.   Reviewed plan of care with patient. Patient tearful and reporting many stressors in home life. Will notify social work to see patient before discharge.  Rolm Bookbindereill, Bricelyn Freestone M, [place] 10/30/2017 1:25 PM

## 2017-10-30 NOTE — MAU Note (Signed)
Lulu RidingColleen Shaw CSW in to see pt.

## 2017-11-02 ENCOUNTER — Inpatient Hospital Stay (HOSPITAL_COMMUNITY)
Admission: AD | Admit: 2017-11-02 | Discharge: 2017-11-02 | Disposition: A | Discharge status: Home / Self Care | Payer: BLUE CROSS/BLUE SHIELD | Source: Ambulatory Visit | Attending: Obstetrics and Gynecology | Admitting: Obstetrics and Gynecology

## 2017-11-02 DIAGNOSIS — R1900 Intra-abdominal and pelvic swelling, mass and lump, unspecified site: Secondary | ICD-10-CM | POA: Diagnosis present

## 2017-11-02 DIAGNOSIS — O00109 Unspecified tubal pregnancy without intrauterine pregnancy: Secondary | ICD-10-CM | POA: Diagnosis not present

## 2017-11-02 LAB — HCG, QUANTITATIVE, PREGNANCY: hCG, Beta Chain, Quant, S: 322 m[IU]/mL — ABNORMAL HIGH (ref <5)

## 2017-11-02 NOTE — MAU Provider Note (Signed)
Subjective:  Tonya Kane is a 27 y.o. G1P0  presents today for FU BHCG. She was seen on 10/30/2017 and received a 2nd dose of MTX due to increasing Quants.  Results from US done on 1/10 shows Left adnexal mass suspicious for ectopic pregnancy.. She denies vaginal bleeding. She denies abdominal or pelvic pain.   Objective:  Physical Exam  Nursing note and vitals reviewed. Constitutional: She is oriented to person, place, and time. She appears well-developed and well-nourished. No distress.  HENT:  Head: Normocephalic.  Cardiovascular: Normal rate.  Respiratory: Effort normal.  GI: Soft. There is no tenderness.  Neurological: She is alert and oriented to person, place, and time. Skin: Skin is warm and dry.  Psychiatric: She has a normal mood and affect.   Results for orders placed or performed during the hospital encounter of 11/02/17 (from the past 24 hour(s))  hCG, quantitative, pregnancy     Status: Abnormal   Collection Time: 11/02/17  9:30 AM  Result Value Ref Range   hCG, Beta Chain, Quant, S 322 (H) <5 mIU/mL   1/13 Day 4 Hcg (round 1): 1,835 1/16 Day 7 Hcg (round 1): 2,104 1/19 Day 1 Hcg (round 2): 322  Assessment/Plan: Left adnexal mass suspicious for ectopic pregnancy. HCG declining; patient stable for discharge.  FU on Tuesday in the office downstairs for day 7 quant. Appointment put in the yellow book. Patient made aware to arrive at 10:30 am Strict return precautions Pelvic rest O positive blood type: rhogam not needed  Venia Carbonasch, [name], NP 11/02/2017 3:30 PM

## 2017-11-02 NOTE — MAU Note (Signed)
Patient presents for repeat HCG following Methotrexate for ectopic pregnancy.  Denies abdominal pain.  Bleeding stopped last night.

## 2017-11-02 NOTE — Discharge Instructions (Signed)
Methotrexate Treatment for an Ectopic Pregnancy, Care After °Refer to this sheet in the next few weeks. These instructions provide you with information on caring for yourself after your procedure. Your health care provider may also give you more specific instructions. Your treatment has been planned according to current medical practices, but problems sometimes occur. Call your health care provider if you have any problems or questions after your procedure. °What can I expect after the procedure? °You may have some abdominal cramping, vaginal bleeding, and fatigue in the first few days after taking methotrexate. Some other possible side effects of methotrexate include: °· Nausea. °· Vomiting. °· Diarrhea. °· Mouth sores. °· Swelling or irritation of the lining of your lungs (pneumonitis). °· Liver damage. °· Hair loss. ° °Follow these instructions at home: °After you have received the methotrexate medicine, you need to be careful of your activities and watch your condition for several weeks. It may take 1 week before your hormone levels return to normal. °Activity °· Do not have sexual intercourse until your health care provider says it is safe to do so. °· You may resume your usual diet. °· Limit strenuous activity. °· Do not drink alcohol. °General instructions °· Do not take aspirin, ibuprofen, or naproxen (nonsteroidal anti-inflammatory drugs [NSAIDs]). °· Do not take folic acid, prenatal vitamins, or other vitamins that contain folic acid. °· Avoid traveling too far away from your health care provider. °· Keep all follow-up visits as told by your health care provider. This is important. °Contact a health care provider if: °· You cannot control your nausea and vomiting. °· You cannot control your diarrhea. °· You have sores in your mouth and want treatment. °· You need pain medicine for your abdominal pain. °· You have a rash. °· You are having a reaction to the medicine. °Get help right away if: °· You have  increasing abdominal or pelvic pain. °· You notice increased bleeding. °· You feel light-headed, or you faint. °· You have shortness of breath. °· Your heart rate increases. °· You have a cough. °· You have chills. °· You have a fever. °This information is not intended to replace advice given to you by your health care provider. Make sure you discuss any questions you have with your health care provider. °Document Released: 09/20/2011 Document Revised: 03/08/2016 Document Reviewed: 07/20/2013 °Elsevier Interactive Patient Education © 2017 [employer] ° °

## 2017-11-05 ENCOUNTER — Ambulatory Visit: Payer: BLUE CROSS/BLUE SHIELD | Admitting: *Deleted

## 2017-11-05 DIAGNOSIS — O3680X0 Pregnancy with inconclusive fetal viability, not applicable or unspecified: Secondary | ICD-10-CM

## 2017-11-05 LAB — HCG, QUANTITATIVE, PREGNANCY: hCG, Beta Chain, Quant, S: 74 m[IU]/mL — ABNORMAL HIGH (ref <5)

## 2017-11-05 NOTE — Addendum Note (Signed)
Addended by: Gerome ApleyZEYFANG, LINDA L on: 11/05/2017 01:24 PM   Modules accepted: Level of Service

## 2017-11-05 NOTE — Progress Notes (Addendum)
Here for stat bhcg. Has had methotrexate twice, last 11/02/17. Denies pain or bleeding. Explained will draw bhcg and run it stat and have her wait for results in lobby; then will review with provider and her ; along with plan of care.   1:09 reviewed results with Dr.Degele. Per provider to tell patient significant drop in bhcg , need follow up non-stat bhcg in one week. Ectopic precautions. Went to lobby and called patient, patient not present.  1:20 Patient returned, reviewed results with her and recommendation to come back one week for bhcg ( routine). Ectopic precautions reveiwed. She voices understanding.

## 2017-11-07 NOTE — Progress Notes (Signed)
I was available in clinic at the time of patient's visit. Agree with nursing note. Repeat b-HCG in 1 week.  Raynelle FanningJulie P. Edmund Holcomb, MD OB Fellow

## 2017-11-12 ENCOUNTER — Other Ambulatory Visit: Payer: BLUE CROSS/BLUE SHIELD

## 2017-11-12 ENCOUNTER — Other Ambulatory Visit: Payer: Self-pay | Admitting: General Practice

## 2017-11-12 DIAGNOSIS — O039 Complete or unspecified spontaneous abortion without complication: Secondary | ICD-10-CM

## 2017-11-13 LAB — BETA HCG QUANT (REF LAB): hCG Quant: 5 m[IU]/mL

## 2017-11-21 ENCOUNTER — Ambulatory Visit (INDEPENDENT_AMBULATORY_CARE_PROVIDER_SITE_OTHER): Payer: BLUE CROSS/BLUE SHIELD | Admitting: Student

## 2017-11-21 ENCOUNTER — Encounter: Payer: Self-pay | Admitting: Student

## 2017-11-21 VITALS — BP 110/70 | HR 75 | Ht 62.0 in | Wt 129.4 lb

## 2017-11-21 DIAGNOSIS — Z5181 Encounter for therapeutic drug level monitoring: Secondary | ICD-10-CM | POA: Diagnosis not present

## 2017-11-21 DIAGNOSIS — Z79899 Other long term (current) drug therapy: Secondary | ICD-10-CM

## 2017-11-21 NOTE — Progress Notes (Signed)
   Subjective:    Patient ID: Tonya Kane, female    DOB: 1991-07-29, 27 y.o.   MRN: [id number]  HPI  Patient Tonya Kane  Is a [age] y.o. G1P0 Here for follow-up after ectopic pregnancy diagnosis and two doses of methotrexate.   Review of Systems  Constitutional: Negative.   HENT: Negative.   Respiratory: Negative.   Cardiovascular: Negative.   Gastrointestinal: Negative.   Genitourinary: Negative.   Neurological: Negative.   Hematological: Negative.   Psychiatric/Behavioral: Negative.        Objective:   Physical Exam  Constitutional: She is oriented to person, place, and time. She appears well-developed.  HENT:  Head: Normocephalic.  Eyes: Pupils are equal, round, and reactive to light.  Neck: Normal range of motion.  Abdominal: Soft.  Musculoskeletal: Normal range of motion.  Neurological: She is alert and oriented to person, place, and time.  Skin: Skin is warm and dry.  Psychiatric: She has a normal mood and affect.          Assessment & Plan:   1. Encounter for methotrexate monitoring    2. Patient doing well; no pain or bleeding.  3. Patient will start her OCP at home; will send My Chart message if she decides she wants a refill.  4. Return in one year for pap smear.  5. Patient has history of depression; currently seeing therapist.

## 2017-11-21 NOTE — Patient Instructions (Signed)
Hormonal Contraception Information °Hormonal contraception is a type of birth control that uses hormones to prevent pregnancy. It usually involves a combination of the hormones estrogen and progesterone or only the hormone progesterone. Hormonal contraception works in these ways: °· It thickens the mucus in the cervix, making it harder for sperm to enter the uterus. °· It changes the lining of the uterus, making it harder for an egg to implant. °· It may stop the ovaries from releasing eggs (ovulation). Some women who take hormonal contraceptives that contain only progesterone may continue to ovulate. ° °Hormonal contraception cannot prevent sexually transmitted infections (STIs). Pregnancy may still occur. °Estrogen and progesterone contraceptives °Contraceptives that use a combination of estrogen and progesterone are available in these forms: °· Pill. Pills come in different combinations of hormones. They must be taken at the same time each day. Pills can affect your period, causing you to get your period once every three months or not at all. °· Patch. The patch must be worn on the lower abdomen for three weeks and then removed on the fourth. °· Vaginal ring. The ring is placed in the vagina and left there for three weeks. It is then removed for one week. ° °Progesterone contraceptives °Contraceptives that use progesterone only are available in these forms: °· Pill. Pills should be taken every day of the cycle. °· Intrauterine device (IUD). This device is inserted into the uterus and removed or replaced every five years or sooner. °· Implant. Plastic rods are placed under the skin of the upper arm. They are removed or replaced every three years or sooner. °· Injection. The injection is given once every 90 days. ° °What are the side effects? °The side effects of estrogen and progesterone contraceptives include: °· Nausea. °· Headaches. °· Breast tenderness. °· Bleeding or spotting between menstrual cycles. °· High  blood pressure (rare). °· Strokes, heart attacks, or blood clots (rare) ° °Side effects of progesterone-only contraceptives include: °· Nausea. °· Headaches. °· Breast tenderness. °· Unpredictable menstrual bleeding. °· High blood pressure (rare). ° °Talk to your health care provider about what side effects may affect you. °Where to find more information: °· Ask your health care provider for more information and resources about hormonal contraception. °· [place] Department of Health and Human Services Office on Women's Health: www.womenshealth.gov °Questions to ask: °· What type of hormonal contraception is right for me? °· How long should I plan to use hormonal contraception? °· What are the side effects of the hormonal contraception method I choose? °· How can I prevent STIs while using hormonal contraception? °Contact a health care provider if: °· You start taking hormonal contraceptives and you develop persistent or severe side effects. °Summary °· Estrogen and progesterone are hormones used in many forms of birth control. °· Talk to your health care provider about what side effects may affect you. °· Hormonal contraception cannot prevent sexually transmitted infections (STIs). °· Ask your health care provider for more information and resources about hormonal contraception. °This information is not intended to replace advice given to you by your health care provider. Make sure you discuss any questions you have with your health care provider. °Document Released: 10/21/2007 Document Revised: 08/31/2016 Document Reviewed: 08/31/2016 °Elsevier Interactive Patient Education © 2018 [employer] ° °

## 2017-12-02 ENCOUNTER — Encounter: Payer: Self-pay | Admitting: Student

## 2017-12-03 ENCOUNTER — Other Ambulatory Visit: Payer: Self-pay | Admitting: Student

## 2017-12-03 MED ORDER — NORGESTIM-ETH ESTRAD TRIPHASIC 0.18/0.215/0.25 MG-25 MCG PO TABS: 1.0000 | ORAL_TABLET | Freq: Every day | ORAL | 11 refills | Status: AC

## 2017-12-06 ENCOUNTER — Telehealth: Payer: Self-pay

## 2017-12-06 ENCOUNTER — Other Ambulatory Visit: Payer: Self-pay | Admitting: Adult Health

## 2017-12-06 DIAGNOSIS — B3731 Acute candidiasis of vulva and vagina: Secondary | ICD-10-CM

## 2017-12-06 DIAGNOSIS — B373 Candidiasis of vulva and vagina: Secondary | ICD-10-CM

## 2017-12-06 MED ORDER — FLUCONAZOLE 150 MG PO TABS: 150.0000 mg | ORAL_TABLET | Freq: Once | ORAL | 3 refills | Status: AC

## 2017-12-06 NOTE — Telephone Encounter (Signed)
Patient is requesting a prescription for symptoms of having a yeast infection.

## 2017-12-09 ENCOUNTER — Other Ambulatory Visit: Payer: Self-pay

## 2017-12-09 MED ORDER — FLUCONAZOLE 150 MG PO TABS: 150.0000 mg | ORAL_TABLET | Freq: Every day | ORAL | 0 refills | Status: AC

## 2018-01-21 ENCOUNTER — Other Ambulatory Visit: Payer: Self-pay | Admitting: Nurse Practitioner

## 2018-01-21 ENCOUNTER — Other Ambulatory Visit (HOSPITAL_COMMUNITY)
Admission: RE | Admit: 2018-01-21 | Discharge: 2018-01-21 | Disposition: A | Discharge status: Home / Self Care | Payer: BLUE CROSS/BLUE SHIELD | Source: Ambulatory Visit | Attending: Nurse Practitioner | Admitting: Nurse Practitioner

## 2018-01-21 DIAGNOSIS — Z01419 Encounter for gynecological examination (general) (routine) without abnormal findings: Secondary | ICD-10-CM | POA: Insufficient documentation

## 2018-01-22 LAB — CYTOLOGY - PAP
Chlamydia: NEGATIVE
DIAGNOSIS: NEGATIVE
Neisseria Gonorrhea: NEGATIVE

## 2019-06-21 IMAGING — US US OB TRANSVAGINAL
1 series · 15 of 28 positions shown · non-contrast
Comparison: None.

CLINICAL DATA: 26-year-old. Gravida 1 para 0. Patient is 6 weeks 6
days by LMP of 09/06/2017. Quantitative beta HCG is pending. Bright
red bleeding today. Pelvic pain for 3 days. Light bleeding for 4
days.

EXAM:
OBSTETRIC <14 WK US AND TRANSVAGINAL OB US
TECHNIQUE: Both transabdominal and transvaginal ultrasound examinations were
performed for complete evaluation of the gestation as well as the
maternal uterus, adnexal regions, and pelvic cul-de-sac.
Transvaginal technique was performed to assess early pregnancy.

[Series 1: us ob transvaginal · 15 of 39 slices shown]
[im 1/39]
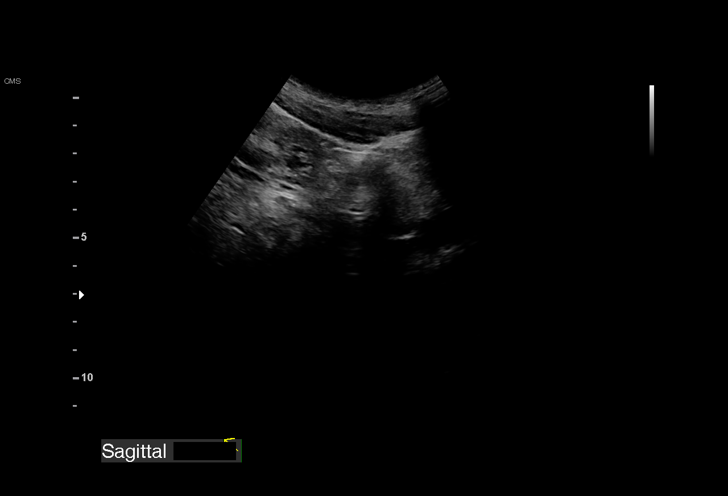
[im 3/39]
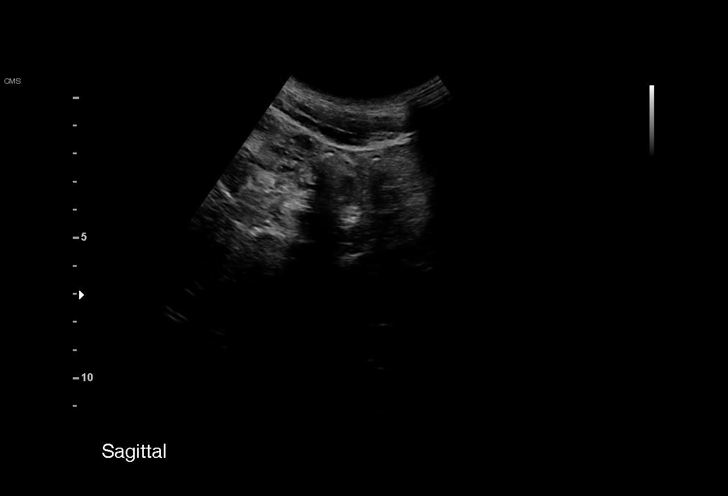
[im 6/39]
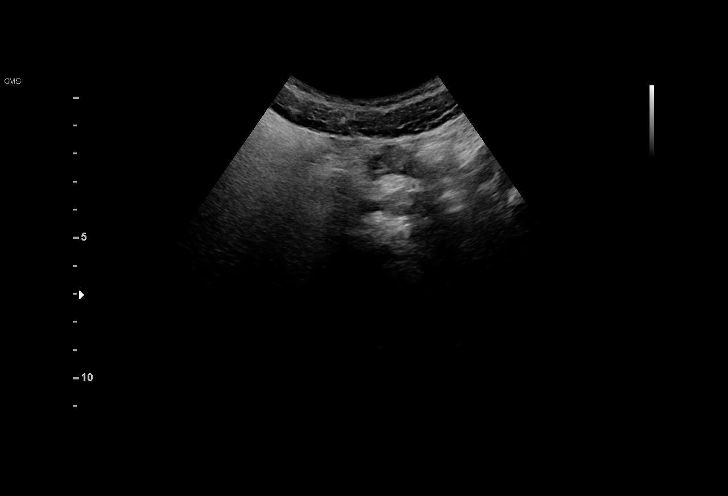
[im 9/39]
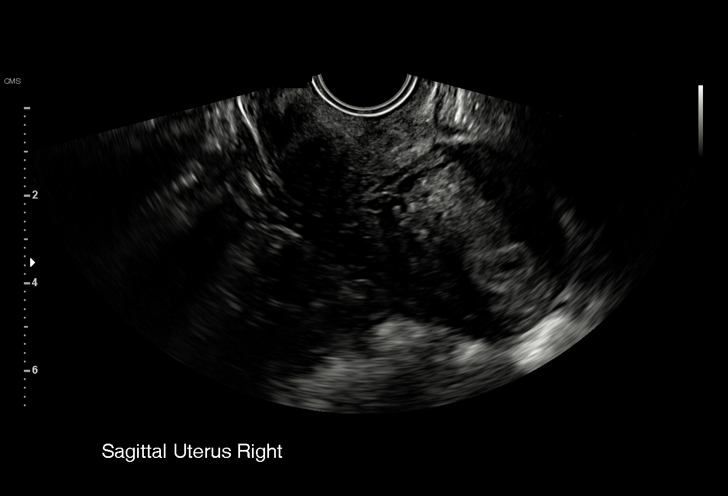
[im 12/39]
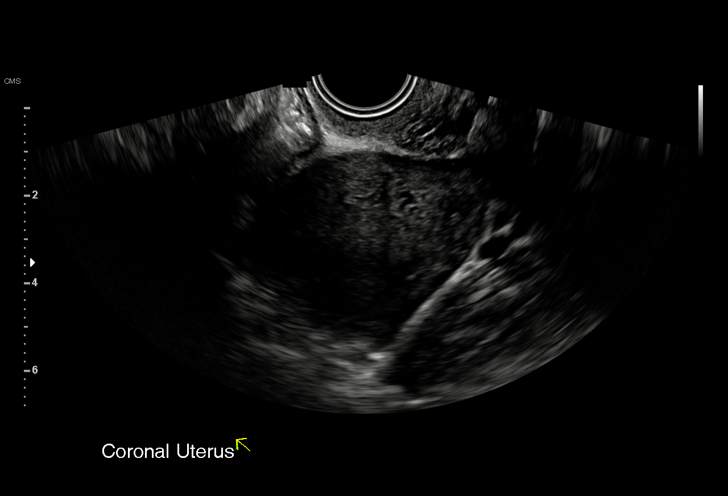
[im 15/39]
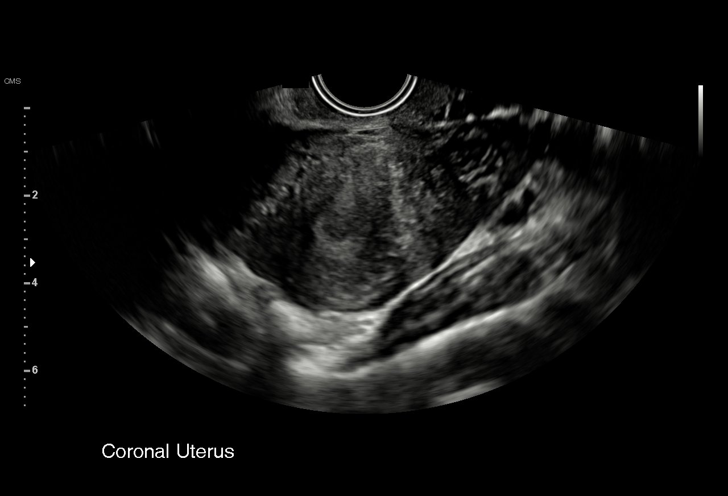
[im 17/39]
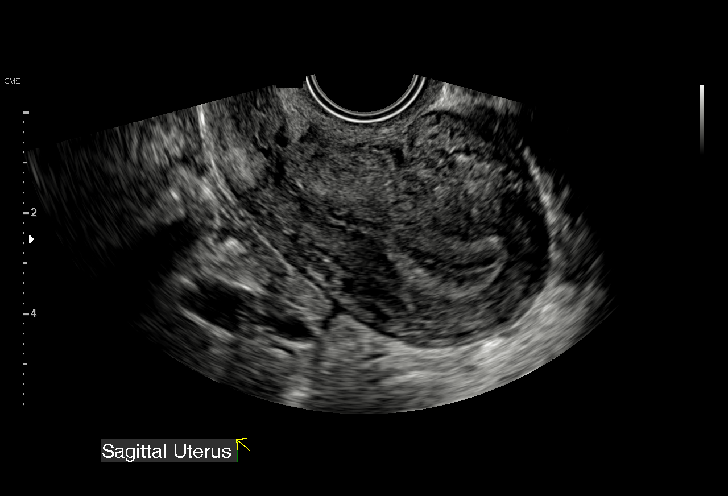
[im 20/39]
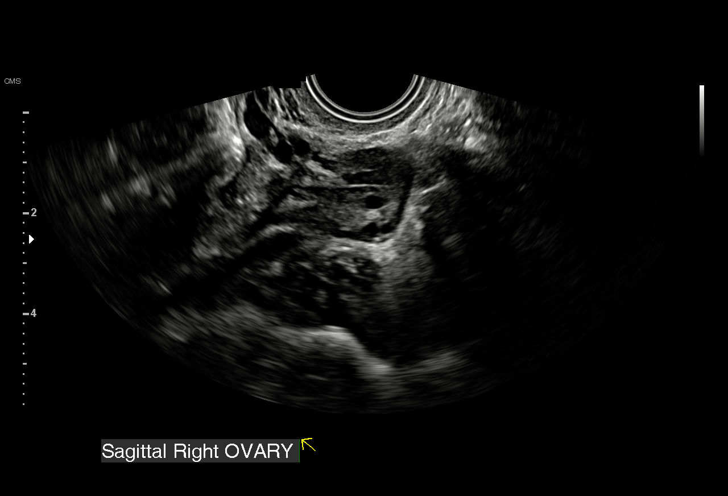
[im 22/39]
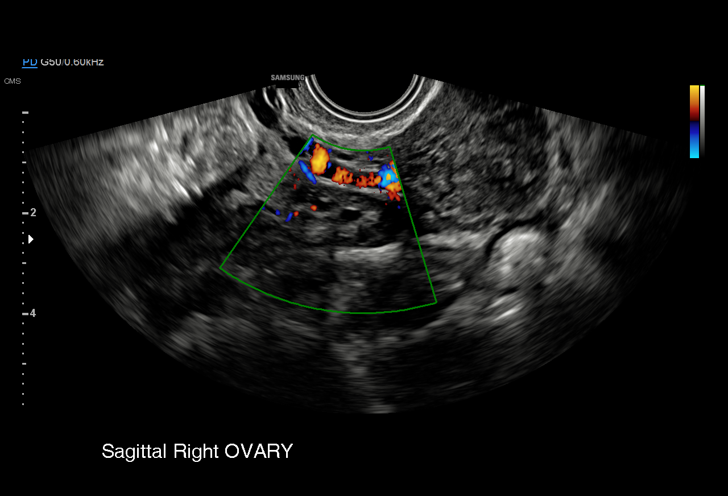
[im 24/39]
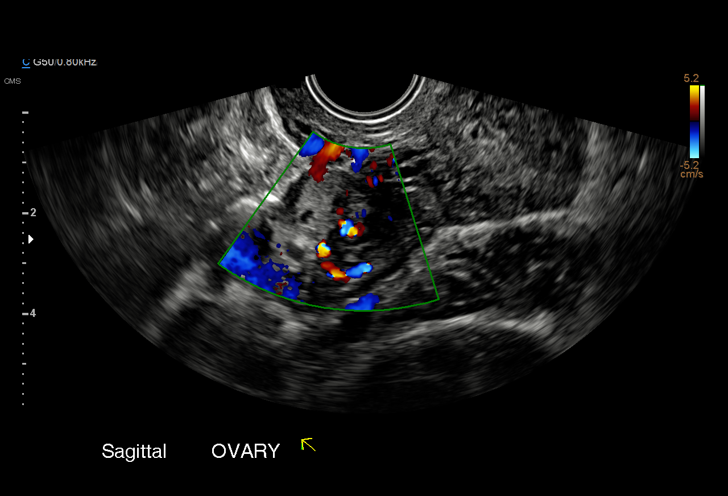
[im 27/39]
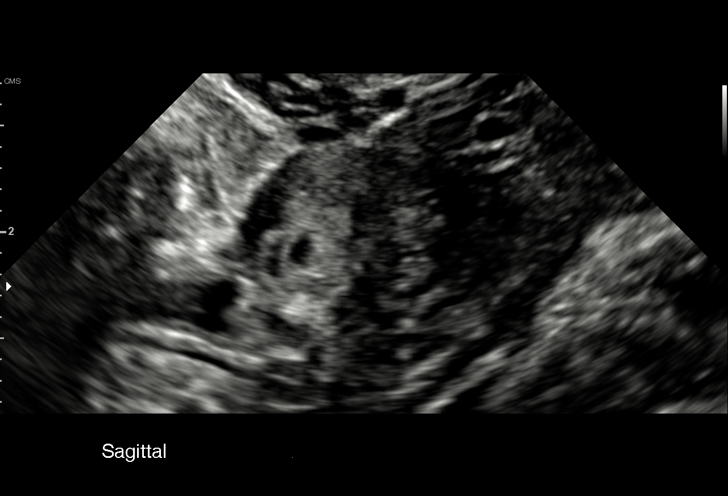
[im 30/39]
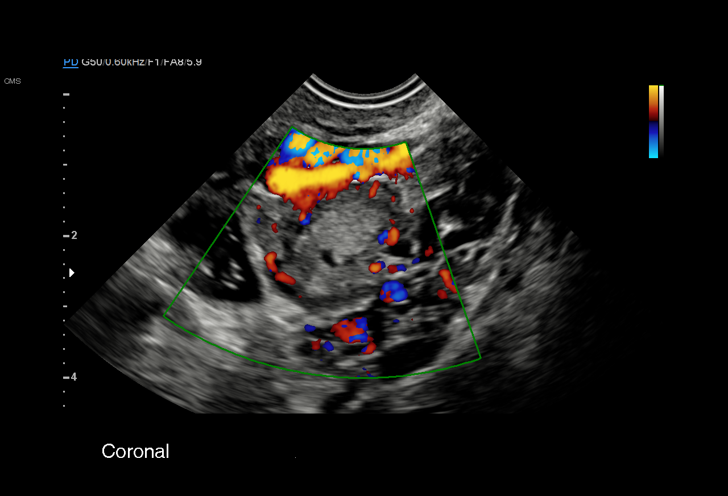
[im 33/39]
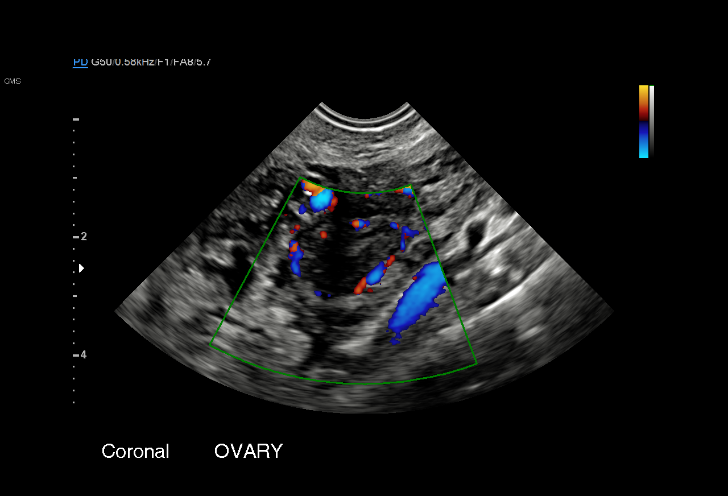
[im 36/39]
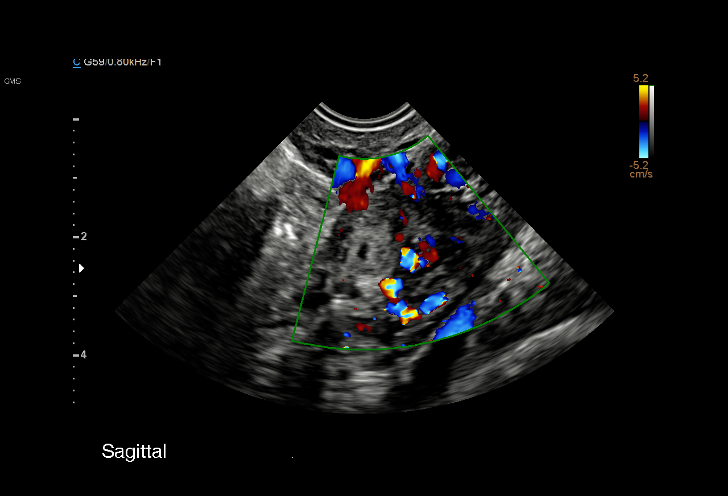
[im 39/39]
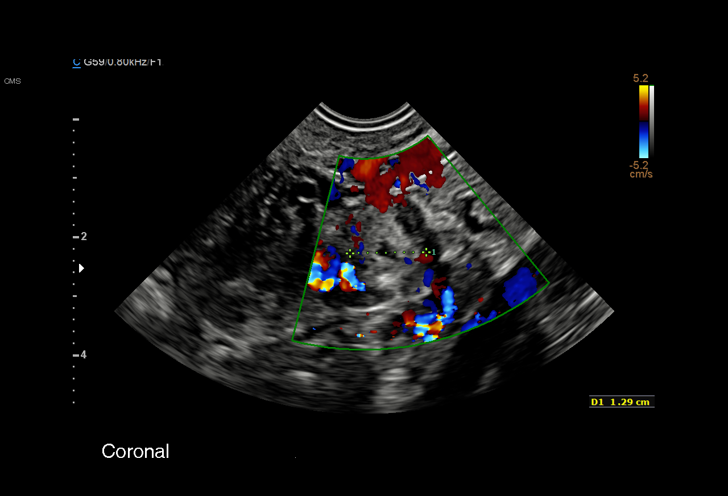

[15 of 28 positions shown; findings below may reference images not displayed]

FINDINGS: Intrauterine gestational sac: None

Yolk sac:  None

Embryo:  None

Cardiac Activity: None

Heart Rate: Absent bpm

Subchorionic hemorrhage:  None visualized.

Maternal uterus/adnexae: Within the left adnexal region, there is a
mass with central anechoic space measuring 1.3 x 1.1 x
centimeters. There is surrounding peripheral blood flow. The left
ovary appears separate from this mass and measures 1.9 centimeters.
Right ovary is normal in appearance, 2.0 centimeters.

No free pelvic fluid.
IMPRESSION: 1. No intrauterine pregnancy.
2. Left adnexal mass suspicious for ectopic pregnancy.
3. No free pelvic fluid.

Critical Value/emergent results were called by telephone at the time
of interpretation on 10/24/2017 at [DATE] to DEONILDO SLA , who
verbally acknowledged these results.

## 2023-11-25 ENCOUNTER — Ambulatory Visit (HOSPITAL_COMMUNITY): Payer: Self-pay | Admitting: Family

## 2024-01-24 ENCOUNTER — Encounter (HOSPITAL_COMMUNITY): Payer: Self-pay

## 2024-01-24 ENCOUNTER — Telehealth (HOSPITAL_COMMUNITY): Payer: Self-pay | Admitting: Psychiatry
